# Patient Record
Sex: Female | Born: 1955 | Race: White | Hispanic: No | Smoking: Former smoker
Health system: Southern US, Community
[De-identification: ages and names within clinical notes are randomized; demographics above are authoritative.]

## PROBLEM LIST (undated history)

## (undated) DIAGNOSIS — E079 Disorder of thyroid, unspecified: Secondary | ICD-10-CM

## (undated) DIAGNOSIS — A159 Respiratory tuberculosis unspecified: Secondary | ICD-10-CM

## (undated) DIAGNOSIS — M1990 Unspecified osteoarthritis, unspecified site: Secondary | ICD-10-CM

## (undated) DIAGNOSIS — F329 Major depressive disorder, single episode, unspecified: Secondary | ICD-10-CM

## (undated) DIAGNOSIS — F419 Anxiety disorder, unspecified: Secondary | ICD-10-CM

## (undated) DIAGNOSIS — R531 Weakness: Secondary | ICD-10-CM

## (undated) DIAGNOSIS — T8859XA Other complications of anesthesia, initial encounter: Secondary | ICD-10-CM

## (undated) DIAGNOSIS — R51 Headache: Secondary | ICD-10-CM

## (undated) DIAGNOSIS — T4145XA Adverse effect of unspecified anesthetic, initial encounter: Secondary | ICD-10-CM

## (undated) DIAGNOSIS — F32A Depression, unspecified: Secondary | ICD-10-CM

## (undated) DIAGNOSIS — G971 Other reaction to spinal and lumbar puncture: Secondary | ICD-10-CM

## (undated) DIAGNOSIS — H9190 Unspecified hearing loss, unspecified ear: Secondary | ICD-10-CM

## (undated) DIAGNOSIS — E039 Hypothyroidism, unspecified: Secondary | ICD-10-CM

## (undated) DIAGNOSIS — Z860100 Personal history of colon polyps, unspecified: Secondary | ICD-10-CM

## (undated) DIAGNOSIS — R609 Edema, unspecified: Secondary | ICD-10-CM

## (undated) DIAGNOSIS — K219 Gastro-esophageal reflux disease without esophagitis: Secondary | ICD-10-CM

## (undated) DIAGNOSIS — R600 Localized edema: Secondary | ICD-10-CM

## (undated) DIAGNOSIS — IMO0001 Reserved for inherently not codable concepts without codable children: Secondary | ICD-10-CM

## (undated) DIAGNOSIS — H8109 Meniere's disease, unspecified ear: Secondary | ICD-10-CM

## (undated) DIAGNOSIS — G4730 Sleep apnea, unspecified: Secondary | ICD-10-CM

## (undated) DIAGNOSIS — K5900 Constipation, unspecified: Secondary | ICD-10-CM

## (undated) HISTORY — DX: Constipation, unspecified: K59.00

## (undated) HISTORY — PX: TONSILLECTOMY: SUR1361

## (undated) HISTORY — DX: Disorder of thyroid, unspecified: E07.9

## (undated) HISTORY — PX: POLYPECTOMY: SHX149

## (undated) HISTORY — DX: Depression, unspecified: F32.A

## (undated) HISTORY — PX: DILATION AND CURETTAGE OF UTERUS: SHX78

## (undated) HISTORY — PX: TUBAL LIGATION: SHX77

## (undated) HISTORY — DX: Unspecified osteoarthritis, unspecified site: M19.90

## (undated) HISTORY — PX: COLONOSCOPY W/ BIOPSIES AND POLYPECTOMY: SHX1376

## (undated) HISTORY — PX: COLONOSCOPY: SHX174

## (undated) HISTORY — DX: Respiratory tuberculosis unspecified: A15.9

## (undated) HISTORY — PX: BUNIONECTOMY: SHX129

## (undated) HISTORY — DX: Meniere's disease, unspecified ear: H81.09

## (undated) HISTORY — DX: Major depressive disorder, single episode, unspecified: F32.9

---

## 1998-10-26 ENCOUNTER — Other Ambulatory Visit: Admission: RE | Admit: 1998-10-26 | Discharge: 1998-10-26 | Payer: Self-pay | Admitting: Obstetrics & Gynecology

## 1999-02-15 ENCOUNTER — Ambulatory Visit (HOSPITAL_COMMUNITY): Admission: RE | Admit: 1999-02-15 | Discharge: 1999-02-15 | Payer: Self-pay | Admitting: Neurosurgery

## 1999-02-15 ENCOUNTER — Encounter: Payer: Self-pay | Admitting: Neurosurgery

## 1999-03-12 ENCOUNTER — Encounter: Payer: Self-pay | Admitting: Neurosurgery

## 1999-03-12 ENCOUNTER — Ambulatory Visit (HOSPITAL_COMMUNITY): Admission: RE | Admit: 1999-03-12 | Discharge: 1999-03-12 | Payer: Self-pay | Admitting: Neurosurgery

## 1999-12-01 ENCOUNTER — Other Ambulatory Visit: Admission: RE | Admit: 1999-12-01 | Discharge: 1999-12-01 | Payer: Self-pay | Admitting: Obstetrics & Gynecology

## 1999-12-07 ENCOUNTER — Ambulatory Visit (HOSPITAL_COMMUNITY): Admission: RE | Admit: 1999-12-07 | Discharge: 1999-12-07 | Payer: Self-pay | Admitting: Obstetrics & Gynecology

## 1999-12-07 ENCOUNTER — Encounter: Payer: Self-pay | Admitting: Obstetrics & Gynecology

## 2001-02-08 ENCOUNTER — Other Ambulatory Visit: Admission: RE | Admit: 2001-02-08 | Discharge: 2001-02-08 | Payer: Self-pay | Admitting: Obstetrics & Gynecology

## 2002-03-15 ENCOUNTER — Other Ambulatory Visit: Admission: RE | Admit: 2002-03-15 | Discharge: 2002-03-15 | Payer: Self-pay | Admitting: Obstetrics & Gynecology

## 2002-03-27 ENCOUNTER — Encounter: Payer: Self-pay | Admitting: Obstetrics & Gynecology

## 2002-03-27 ENCOUNTER — Ambulatory Visit (HOSPITAL_COMMUNITY): Admission: RE | Admit: 2002-03-27 | Discharge: 2002-03-27 | Payer: Self-pay | Admitting: Obstetrics & Gynecology

## 2003-04-01 ENCOUNTER — Other Ambulatory Visit: Admission: RE | Admit: 2003-04-01 | Discharge: 2003-04-01 | Payer: Self-pay | Admitting: Obstetrics & Gynecology

## 2003-04-02 ENCOUNTER — Ambulatory Visit (HOSPITAL_COMMUNITY): Admission: RE | Admit: 2003-04-02 | Discharge: 2003-04-02 | Payer: Self-pay | Admitting: Obstetrics & Gynecology

## 2003-04-02 ENCOUNTER — Encounter: Payer: Self-pay | Admitting: Obstetrics & Gynecology

## 2004-04-08 ENCOUNTER — Encounter: Admission: RE | Admit: 2004-04-08 | Discharge: 2004-04-08 | Payer: Self-pay | Admitting: Obstetrics & Gynecology

## 2004-05-26 ENCOUNTER — Other Ambulatory Visit: Admission: RE | Admit: 2004-05-26 | Discharge: 2004-05-26 | Payer: Self-pay | Admitting: Obstetrics & Gynecology

## 2004-08-05 ENCOUNTER — Encounter: Admission: RE | Admit: 2004-08-05 | Discharge: 2004-08-05 | Payer: Self-pay | Admitting: Diagnostic Radiology

## 2005-01-13 ENCOUNTER — Ambulatory Visit (HOSPITAL_BASED_OUTPATIENT_CLINIC_OR_DEPARTMENT_OTHER): Admission: RE | Admit: 2005-01-13 | Discharge: 2005-01-14 | Payer: Self-pay | Admitting: Orthopedic Surgery

## 2005-01-13 ENCOUNTER — Ambulatory Visit (HOSPITAL_COMMUNITY): Admission: RE | Admit: 2005-01-13 | Discharge: 2005-01-13 | Payer: Self-pay | Admitting: Orthopedic Surgery

## 2005-07-22 ENCOUNTER — Encounter: Admission: RE | Admit: 2005-07-22 | Discharge: 2005-07-22 | Payer: Self-pay | Admitting: Orthopedic Surgery

## 2005-08-02 ENCOUNTER — Other Ambulatory Visit: Admission: RE | Admit: 2005-08-02 | Discharge: 2005-08-02 | Payer: Self-pay | Admitting: Obstetrics & Gynecology

## 2005-08-03 ENCOUNTER — Encounter: Admission: RE | Admit: 2005-08-03 | Discharge: 2005-08-03 | Payer: Self-pay | Admitting: Obstetrics & Gynecology

## 2005-11-10 ENCOUNTER — Ambulatory Visit (HOSPITAL_BASED_OUTPATIENT_CLINIC_OR_DEPARTMENT_OTHER): Admission: RE | Admit: 2005-11-10 | Discharge: 2005-11-11 | Payer: Self-pay | Admitting: Orthopedic Surgery

## 2005-12-07 ENCOUNTER — Encounter: Admission: RE | Admit: 2005-12-07 | Discharge: 2005-12-07 | Payer: Self-pay | Admitting: Orthopedic Surgery

## 2006-04-27 ENCOUNTER — Encounter: Admission: RE | Admit: 2006-04-27 | Discharge: 2006-04-27 | Payer: Self-pay | Admitting: Orthopedic Surgery

## 2006-08-09 ENCOUNTER — Encounter: Admission: RE | Admit: 2006-08-09 | Discharge: 2006-08-09 | Payer: Self-pay | Admitting: Obstetrics & Gynecology

## 2008-05-26 ENCOUNTER — Encounter: Admission: RE | Admit: 2008-05-26 | Discharge: 2008-05-26 | Payer: Self-pay | Admitting: Obstetrics & Gynecology

## 2008-07-08 ENCOUNTER — Encounter: Admission: RE | Admit: 2008-07-08 | Discharge: 2008-07-08 | Payer: Self-pay | Admitting: Endocrinology

## 2008-12-24 ENCOUNTER — Encounter: Admission: RE | Admit: 2008-12-24 | Discharge: 2008-12-24 | Payer: Self-pay | Admitting: Endocrinology

## 2009-06-22 ENCOUNTER — Encounter: Admission: RE | Admit: 2009-06-22 | Discharge: 2009-06-22 | Payer: Self-pay | Admitting: General Surgery

## 2009-07-21 ENCOUNTER — Encounter: Admission: RE | Admit: 2009-07-21 | Discharge: 2009-07-21 | Payer: Self-pay | Admitting: Obstetrics & Gynecology

## 2010-08-15 ENCOUNTER — Encounter: Payer: Self-pay | Admitting: Obstetrics & Gynecology

## 2010-10-06 ENCOUNTER — Other Ambulatory Visit: Payer: Self-pay | Admitting: Endocrinology

## 2010-10-06 DIAGNOSIS — E049 Nontoxic goiter, unspecified: Secondary | ICD-10-CM

## 2010-10-22 ENCOUNTER — Ambulatory Visit (HOSPITAL_BASED_OUTPATIENT_CLINIC_OR_DEPARTMENT_OTHER)
Admission: RE | Admit: 2010-10-22 | Discharge: 2010-10-22 | Disposition: A | Discharge status: Home / Self Care | Payer: 59 | Source: Ambulatory Visit | Attending: Endocrinology | Admitting: Endocrinology

## 2010-10-22 DIAGNOSIS — E039 Hypothyroidism, unspecified: Secondary | ICD-10-CM | POA: Insufficient documentation

## 2010-10-22 DIAGNOSIS — E041 Nontoxic single thyroid nodule: Secondary | ICD-10-CM | POA: Insufficient documentation

## 2010-10-22 DIAGNOSIS — E049 Nontoxic goiter, unspecified: Secondary | ICD-10-CM

## 2010-11-08 ENCOUNTER — Other Ambulatory Visit: Payer: Self-pay

## 2010-11-26 ENCOUNTER — Encounter: Payer: Self-pay | Admitting: Internal Medicine

## 2010-11-26 ENCOUNTER — Ambulatory Visit (AMBULATORY_SURGERY_CENTER): Payer: 59 | Admitting: *Deleted

## 2010-11-26 VITALS — Ht 69.0 in | Wt 229.0 lb

## 2010-11-26 DIAGNOSIS — Z1211 Encounter for screening for malignant neoplasm of colon: Secondary | ICD-10-CM

## 2010-11-26 MED ORDER — PEG-KCL-NACL-NASULF-NA ASC-C 100 G PO SOLR: ORAL | Status: DC

## 2010-12-10 ENCOUNTER — Ambulatory Visit (AMBULATORY_SURGERY_CENTER): Payer: 59 | Admitting: Internal Medicine

## 2010-12-10 ENCOUNTER — Encounter: Payer: Self-pay | Admitting: Internal Medicine

## 2010-12-10 VITALS — BP 124/78 | HR 80 | Temp 97.3°F | Resp 18 | Ht 69.0 in | Wt 229.0 lb

## 2010-12-10 DIAGNOSIS — Z1211 Encounter for screening for malignant neoplasm of colon: Secondary | ICD-10-CM

## 2010-12-10 DIAGNOSIS — D126 Benign neoplasm of colon, unspecified: Secondary | ICD-10-CM

## 2010-12-10 DIAGNOSIS — K6389 Other specified diseases of intestine: Secondary | ICD-10-CM

## 2010-12-10 MED ORDER — SODIUM CHLORIDE 0.9 % IV SOLN: 500.0000 mL | INTRAVENOUS | Status: DC

## 2010-12-10 NOTE — Op Note (Signed)
Rose Boyer, Rose Boyer              ACCOUNT NO.:  [id number]   MEDICAL RECORD NO.:  [id number]          PATIENT TYPE:  AMB   LOCATION:  DSC                          FACILITY:  [facility]   PHYSICIAN:  Nadara Mustard, MD     DATE OF BIRTH:  [birth year]   DATE OF PROCEDURE:  01/13/2005  DATE OF DISCHARGE:                                 OPERATIVE REPORT   PREOPERATIVE DIAGNOSES:  1.  Hallux valgus deformity of right great toe.  2.  Clawing with metatarsalgia of the right second toe.   POSTOPERATIVE DIAGNOSES:  1.  Hallux valgus deformity of right great toe.  2.  Clawing with metatarsalgia of the right second toe.   PROCEDURE:  1.  Right first metatarsal Ludloff osteotomy.  2.  Right first proximal phalanx Aiken osteotomy.  3.  Second metatarsal Weil osteotomy.   SURGEON.:  Nadara Mustard, MD   ANESTHESIA:  Popliteal block plus LMA.   ESTIMATED BLOOD LOSS:  Minimal.   ANTIBIOTICS:  None.   DRAINS:  None.   COMPLICATIONS:  None.   DISPOSITION:  To PACU in stable condition.   INDICATIONS FOR PROCEDURE:  The patient is a 55 year old woman with a severe  hallux valgus deformity of the right great toe and clawing of the second toe  with prominent metatarsal head of the second toe. Patient has failed  conservative care and presents at this time for surgical intervention. The  risks and benefits were discussed including infection, neurovascular injury,  persistent pain, fracture of the bone, need for additional surgery. The  patient states she understands and wished proceed at this time.   DESCRIPTION OF PROCEDURE:  The patient was brought to OR room 6 underwent  after undergoing a popliteal block. The patient underwent LMA anesthetic.  After adequate level of anesthesia obtained, the patient's right lower  extremity was prepped using DuraPrep and draped into a sterile field. The  leg was elevated and Esmarch was wrapped around the ankle for tourniquet  control. An incision was made  longitudinally on the medial border of the  first metatarsal. This was carried down to bone. Subperiosteal dissection  was used to expose the medial aspect of the first metatarsal. A Ludloff  osteotomy was performed. This was secured proximally with a lag screw  technique 18 mm cortical screw. The osteotomy was then rotated, secured with  a distal cortical screw using lag screw technique.  An ostectomy of the  medial prominence of the first metatarsal as well as from the osteotomy site  was performed. The patient still had valgus deformity of the great toe and  an Aiken osteotomy was performed and the toe was stabilized using a 0.65 K-  wire. The wound was irrigated with normal saline. The retinaculum was closed  using a running 2-0 Vicryl. The skin was closed using 3-0 nylon with far-  near-near-far suture. Attention was then focused on the second toe. Incision  was carried down over the second MTP joint. Visualization showed a large  amount of synovitis and bone spurs over the second toe. A Weil osteotomy was  performed.  Bony spurs were removed. The synovitis was removed. This was  then stabilized with a 2.0 cortical mini fragment screw. The patient had  correction of the prominence of the second metatarsal. The wounds were  irrigated with normal saline. Skin was closed using 3-0 nylon using a far-  near-near-far suture. Wound was covered Adaptic orthopedic sponges, sterile  Webril and a Coban dressing. The patient was then taken to PACU in stable  condition. The tourniquet was deflated after the surgical procedure. The  patient was taken to PACU in stable condition. Plan for 23 hour observation,  discharge in the morning by nursing.  She is be touchdown weightbearing on  the right. She has a prescription for Vicodin and Tylox.  Follow-up in the  office on Monday for dressing change.       MVD/MEDQ  D:  01/13/2005  T:  01/13/2005  Job:  [id number]

## 2010-12-10 NOTE — Op Note (Signed)
Rose Boyer, Rose Boyer              ACCOUNT NO.:  [id number]   MEDICAL RECORD NO.:  [id number]          PATIENT TYPE:  AMB   LOCATION:  DSC                          FACILITY:  [facility]   PHYSICIAN:  Nadara Mustard, MD     DATE OF BIRTH:  January 11, 1956   DATE OF PROCEDURE:  11/10/2005  DATE OF DISCHARGE:                                 OPERATIVE REPORT   PREOP DIAGNOSIS:  1.  Severe hallux valgus deformity left foot.  2.  Painful retained hardware right foot.   PROCEDURE:  1.  Removal of deep buried hardware right foot screws x2.  2.  Ludloff osteotomy left great toe first metatarsal.  3  Aiken osteotomy left great toe proximal phalanx.   SURGEON:  Nadara Mustard, MD   ANESTHESIA:  General.   ESTIMATED BLOOD LOSS:  Minimal.   ANTIBIOTICS:  1 gram of Kefzol.   DRAINS:  None.   COMPLICATIONS:  None   TOURNIQUET TIME:  Esmarch at the ankle for approximately 27 minutes on the  left ankle.   DISPOSITION:  To PACU in stable condition.   INDICATIONS FOR PROCEDURE:  The patient is a 55 year old woman who was  status post bunion surgery on the right. Patient is pleased with her  progress with the right foot; and has progressive pain with the left foot;  also has pain with retained hardware in the right foot. She had hallux  valgus angle on the left of 60 degrees, and intermetatarsal angle of 20  degrees and presents, at this time, for surgical intervention for bunion  surgery on the left including a Ludloff and Aiken osteotomy, and the removal  of deep hardware on the right. Risks and benefits were discussed including  infection, neurovascular injury, persistent pain, recurrence of the  deformity, need for additional surgery. The patient states that he  understands and wishes to proceed at this time.   DESCRIPTION OF PROCEDURE:  The patient was brought to OR room #5 and  underwent a general anesthetic. After an adequate level of anesthesia was  obtained the patient's bilateral lower  extremities were prepped using  DuraPrep and draped in a sterile field. Two stab incisions were made over  the screws on the right foot. Using C-arm fluoroscopy for guidance the 2  screws were removed without complications. The wounds were irrigated; and  the incisions were closed using a closed using 2-0 nylon with a modified  vertical mattress suture. The foot was covered; and attention was then  focused on the left foot. An esmarch was wrapped around the ankle for  tourniquet control. A medial longitudinal incision was made over the first  column. This was carried down to the bone. Subperiosteal dissection was used  to incise the capsule. An ostectomy was performed off the medial eminence of  the great toe first metatarsal. A separate incision was then made in the  first web space and the capsule was released, releasing the abductor  muscles.   Attention was then focused on the first metatarsal. A Ludloff osteotomy was  performed. This was secured proximally with a  2.7 screw and distally, also,  with a 2.7 screw.  A second ostectomy was performed from the bone of the  Ludloff osteotomy.   Attention was then focused on the proximal phalanx. A wedge ostectomy was  performed with a lateral wedge resected from the first metatarsal. The toe  was aligned with the first column and this was stabilized with a 1.6 mm K-  wire. The wounds were irrigated with normal saline. The Esmarch was released  after 27 minutes. Hemostasis was obtained. The retinaculum was closed using  a 2-0 Vicryl. The skin was closed using a modified vertical mattress with 2-  0 nylon. The wounds were covered with Adaptic orthopedic sponges, Webril,  and a Coban dressing for both feet. The patient was extubated; taken to PACU  in stable condition; follow up in office in 2 weeks.      Nadara Mustard, MD  Electronically Signed     MVD/MEDQ  D:  11/10/2005  T:  11/11/2005  Job:  [id number]

## 2010-12-10 NOTE — Patient Instructions (Signed)
Refer to blue and green discharge instruction sheets. 

## 2010-12-13 ENCOUNTER — Telehealth: Payer: Self-pay

## 2010-12-13 NOTE — Telephone Encounter (Signed)

## 2010-12-16 ENCOUNTER — Encounter: Payer: Self-pay | Admitting: Internal Medicine

## 2011-06-15 ENCOUNTER — Encounter (HOSPITAL_BASED_OUTPATIENT_CLINIC_OR_DEPARTMENT_OTHER): Payer: Self-pay | Admitting: Radiology

## 2011-06-15 ENCOUNTER — Other Ambulatory Visit (HOSPITAL_BASED_OUTPATIENT_CLINIC_OR_DEPARTMENT_OTHER): Payer: Self-pay | Admitting: Radiology

## 2011-11-23 ENCOUNTER — Ambulatory Visit (HOSPITAL_BASED_OUTPATIENT_CLINIC_OR_DEPARTMENT_OTHER)
Admission: RE | Admit: 2011-11-23 | Discharge: 2011-11-23 | Disposition: A | Discharge status: Home / Self Care | Payer: 59 | Source: Ambulatory Visit | Attending: Obstetrics & Gynecology | Admitting: Obstetrics & Gynecology

## 2011-11-23 ENCOUNTER — Other Ambulatory Visit (HOSPITAL_BASED_OUTPATIENT_CLINIC_OR_DEPARTMENT_OTHER): Payer: Self-pay | Admitting: Obstetrics & Gynecology

## 2011-11-23 DIAGNOSIS — Z1231 Encounter for screening mammogram for malignant neoplasm of breast: Secondary | ICD-10-CM

## 2012-03-01 ENCOUNTER — Ambulatory Visit (INDEPENDENT_AMBULATORY_CARE_PROVIDER_SITE_OTHER): Payer: 59 | Admitting: Internal Medicine

## 2012-03-01 ENCOUNTER — Encounter: Payer: Self-pay | Admitting: Internal Medicine

## 2012-03-01 VITALS — BP 110/70 | HR 88 | Temp 97.2°F | Resp 16 | Ht 69.0 in | Wt 233.0 lb

## 2012-03-01 DIAGNOSIS — Z803 Family history of malignant neoplasm of breast: Secondary | ICD-10-CM

## 2012-03-01 DIAGNOSIS — F3289 Other specified depressive episodes: Secondary | ICD-10-CM

## 2012-03-01 DIAGNOSIS — H8109 Meniere's disease, unspecified ear: Secondary | ICD-10-CM | POA: Insufficient documentation

## 2012-03-01 DIAGNOSIS — E042 Nontoxic multinodular goiter: Secondary | ICD-10-CM

## 2012-03-01 DIAGNOSIS — R0609 Other forms of dyspnea: Secondary | ICD-10-CM

## 2012-03-01 DIAGNOSIS — F329 Major depressive disorder, single episode, unspecified: Secondary | ICD-10-CM

## 2012-03-01 DIAGNOSIS — R0600 Dyspnea, unspecified: Secondary | ICD-10-CM

## 2012-03-01 DIAGNOSIS — E669 Obesity, unspecified: Secondary | ICD-10-CM

## 2012-03-01 DIAGNOSIS — R0989 Other specified symptoms and signs involving the circulatory and respiratory systems: Secondary | ICD-10-CM

## 2012-03-01 DIAGNOSIS — Z780 Asymptomatic menopausal state: Secondary | ICD-10-CM

## 2012-03-01 DIAGNOSIS — F32A Depression, unspecified: Secondary | ICD-10-CM | POA: Insufficient documentation

## 2012-03-01 NOTE — Progress Notes (Signed)
  Subjective:    Patient ID: Rose Boyer, female    DOB: 05-05-1956, 56 y.o.   MRN: [id number]  HPI  Rose Boyer is a new pt here to establish primary care.  Former care from her OB/GYN.  PMH of MNG cared for by Dr. Talmage Nap, Depression, meniere's disease and menopause.  FH positive for breast cancer in mother  Rose Boyer is concerned over multiple issues that include her increased weight, dyspnea on exertion and states  "I just don't feel good and I am not sure why".  She denies chest pain or palpitations  She states she spoke with Dr. Jennette Kettle and asked him to place her back on HT .  She has been on Vivelle dot and Provera q 3months for about 4 months now.  She denies severe hot flushes or night sweats.  She also reports that she was on both Wellbutrin and Lexapro in the past but she stopped the Lexapro in the last 18 months.  She has never seen a psychiatrist for a diagnosis and is not seeing a therapist  She is aware her job is stressful but she states she has a good life.  Father had depression   Review of Systems See HPI    Objective:   Physical ExamPhysical Exam  Nursing note and vitals reviewed.  Constitutional: She is oriented to person, place, and time. She appears well-developed and well-nourished.  HENT:  Head: Normocephalic and atraumatic.  Cardiovascular: Normal rate and regular rhythm. Exam reveals no gallop and no friction rub.  No murmur heard.  Pulmonary/Chest: Breath sounds normal. She has no wheezes. She has no rales.  Neurological: She is alert and oriented to person, place, and time.  Skin: Skin is warm and dry.  Psychiatric: She has a normal mood and affect. Her behavior is normal.              Assessment & Plan:  Depression  I gave her the nunmber to two therapists Rose Boyer and Rose Boyer and advised her to call for talking therapy  Weight gain  Will check TSH today and refer to Dr. Kinnie Scales  Dyspnea  willl refer to Dr. Allyson Sabal for further diagnostic  assessment.  Check fasting lipids in am   MNG managed by dr. Talmage Nap  FH breast ca in mother  History of menieres disease Dr. Dorma Russell

## 2012-03-01 NOTE — Patient Instructions (Addendum)
Labs will be mailed to you  Will set up referrral to Gi and cardiology

## 2012-03-05 ENCOUNTER — Telehealth: Payer: Self-pay | Admitting: Internal Medicine

## 2012-03-05 NOTE — Telephone Encounter (Signed)
LM on work voice mail to call and we would do an EKG as we meant to get it last week when she was here.

## 2012-03-05 NOTE — Telephone Encounter (Signed)
Rose Boyer  Call pt and ask her to come in this week sometime for an EKG when I an here and I will interpret EKG for her

## 2012-03-06 ENCOUNTER — Ambulatory Visit (HOSPITAL_BASED_OUTPATIENT_CLINIC_OR_DEPARTMENT_OTHER)
Admission: RE | Admit: 2012-03-06 | Discharge: 2012-03-06 | Disposition: A | Discharge status: Home / Self Care | Payer: 59 | Source: Ambulatory Visit | Attending: Obstetrics & Gynecology | Admitting: Obstetrics & Gynecology

## 2012-03-06 ENCOUNTER — Other Ambulatory Visit (HOSPITAL_BASED_OUTPATIENT_CLINIC_OR_DEPARTMENT_OTHER): Payer: Self-pay | Admitting: Obstetrics & Gynecology

## 2012-03-06 DIAGNOSIS — Z139 Encounter for screening, unspecified: Secondary | ICD-10-CM

## 2012-03-08 LAB — CBC WITH DIFFERENTIAL/PLATELET
Basophils Absolute: 0 10*3/uL (ref 0.0–0.1)
Basophils Relative: 1 % (ref 0–1)
Eosinophils Absolute: 0 10*3/uL (ref 0.0–0.7)
Eosinophils Relative: 1 % (ref 0–5)
MCH: 30.4 pg (ref 26.0–34.0)
MCV: 85.9 fL (ref 78.0–100.0)
Neutrophils Relative %: 56 % (ref 43–77)
Platelets: 331 10*3/uL (ref 150–400)
RDW: 13.2 % (ref 11.5–15.5)
WBC: 4.8 10*3/uL (ref 4.0–10.5)

## 2012-03-08 LAB — COMPREHENSIVE METABOLIC PANEL
ALT: 38 U/L — ABNORMAL HIGH (ref 0–35)
AST: 25 U/L (ref 0–37)
Alkaline Phosphatase: 88 U/L (ref 39–117)
Creat: 1.06 mg/dL (ref 0.50–1.10)
Sodium: 140 mEq/L (ref 135–145)
Total Bilirubin: 0.6 mg/dL (ref 0.3–1.2)

## 2012-03-08 LAB — LIPID PANEL
Cholesterol: 229 mg/dL — ABNORMAL HIGH (ref 0–200)
HDL: 59 mg/dL (ref >39)
Triglycerides: 125 mg/dL (ref <150)

## 2012-03-09 LAB — VITAMIN D 25 HYDROXY (VIT D DEFICIENCY, FRACTURES): Vit D, 25-Hydroxy: 31 ng/mL (ref 30–89)

## 2012-03-13 ENCOUNTER — Telehealth: Payer: Self-pay | Admitting: *Deleted

## 2012-03-13 NOTE — Telephone Encounter (Signed)
Copy of labs mailed to pt's home address. 

## 2012-03-30 ENCOUNTER — Other Ambulatory Visit: Payer: Self-pay | Admitting: Occupational Medicine

## 2012-03-30 ENCOUNTER — Ambulatory Visit (HOSPITAL_BASED_OUTPATIENT_CLINIC_OR_DEPARTMENT_OTHER): Payer: Self-pay

## 2012-03-30 DIAGNOSIS — Z0000 Encounter for general adult medical examination without abnormal findings: Secondary | ICD-10-CM

## 2012-08-24 ENCOUNTER — Ambulatory Visit (HOSPITAL_BASED_OUTPATIENT_CLINIC_OR_DEPARTMENT_OTHER)
Admission: RE | Admit: 2012-08-24 | Discharge: 2012-08-24 | Disposition: A | Discharge status: Home / Self Care | Payer: 59 | Source: Ambulatory Visit | Attending: Endocrinology | Admitting: Endocrinology

## 2012-08-24 ENCOUNTER — Other Ambulatory Visit (HOSPITAL_BASED_OUTPATIENT_CLINIC_OR_DEPARTMENT_OTHER): Payer: Self-pay | Admitting: Endocrinology

## 2012-08-24 DIAGNOSIS — E049 Nontoxic goiter, unspecified: Secondary | ICD-10-CM | POA: Insufficient documentation

## 2012-08-24 DIAGNOSIS — Z09 Encounter for follow-up examination after completed treatment for conditions other than malignant neoplasm: Secondary | ICD-10-CM | POA: Insufficient documentation

## 2012-11-27 ENCOUNTER — Emergency Department (HOSPITAL_BASED_OUTPATIENT_CLINIC_OR_DEPARTMENT_OTHER)
Admission: EM | Admit: 2012-11-27 | Discharge: 2012-11-27 | Disposition: A | Discharge status: Home / Self Care | Payer: 59 | Attending: Emergency Medicine | Admitting: Emergency Medicine

## 2012-11-27 ENCOUNTER — Emergency Department (HOSPITAL_BASED_OUTPATIENT_CLINIC_OR_DEPARTMENT_OTHER): Payer: 59

## 2012-11-27 ENCOUNTER — Encounter (HOSPITAL_BASED_OUTPATIENT_CLINIC_OR_DEPARTMENT_OTHER): Payer: Self-pay | Admitting: *Deleted

## 2012-11-27 DIAGNOSIS — Z87891 Personal history of nicotine dependence: Secondary | ICD-10-CM | POA: Diagnosis not present

## 2012-11-27 DIAGNOSIS — E079 Disorder of thyroid, unspecified: Secondary | ICD-10-CM | POA: Diagnosis not present

## 2012-11-27 DIAGNOSIS — Y9389 Activity, other specified: Secondary | ICD-10-CM | POA: Insufficient documentation

## 2012-11-27 DIAGNOSIS — S199XXA Unspecified injury of neck, initial encounter: Secondary | ICD-10-CM | POA: Diagnosis present

## 2012-11-27 DIAGNOSIS — F3289 Other specified depressive episodes: Secondary | ICD-10-CM | POA: Diagnosis not present

## 2012-11-27 DIAGNOSIS — Z8669 Personal history of other diseases of the nervous system and sense organs: Secondary | ICD-10-CM | POA: Diagnosis not present

## 2012-11-27 DIAGNOSIS — Z79899 Other long term (current) drug therapy: Secondary | ICD-10-CM | POA: Diagnosis not present

## 2012-11-27 DIAGNOSIS — Z8611 Personal history of tuberculosis: Secondary | ICD-10-CM | POA: Insufficient documentation

## 2012-11-27 DIAGNOSIS — Y92410 Unspecified street and highway as the place of occurrence of the external cause: Secondary | ICD-10-CM | POA: Diagnosis not present

## 2012-11-27 DIAGNOSIS — Z8619 Personal history of other infectious and parasitic diseases: Secondary | ICD-10-CM | POA: Insufficient documentation

## 2012-11-27 DIAGNOSIS — Z8739 Personal history of other diseases of the musculoskeletal system and connective tissue: Secondary | ICD-10-CM | POA: Diagnosis not present

## 2012-11-27 DIAGNOSIS — F329 Major depressive disorder, single episode, unspecified: Secondary | ICD-10-CM | POA: Diagnosis not present

## 2012-11-27 DIAGNOSIS — R209 Unspecified disturbances of skin sensation: Secondary | ICD-10-CM | POA: Insufficient documentation

## 2012-11-27 DIAGNOSIS — S0993XA Unspecified injury of face, initial encounter: Secondary | ICD-10-CM | POA: Diagnosis present

## 2012-11-27 DIAGNOSIS — S161XXA Strain of muscle, fascia and tendon at neck level, initial encounter: Secondary | ICD-10-CM

## 2012-11-27 DIAGNOSIS — S139XXA Sprain of joints and ligaments of unspecified parts of neck, initial encounter: Secondary | ICD-10-CM | POA: Diagnosis not present

## 2012-11-27 MED ORDER — HYDROCODONE-ACETAMINOPHEN 5-325 MG PO TABS: 2.0000 | ORAL_TABLET | ORAL | Status: DC | PRN

## 2012-11-27 MED ORDER — IBUPROFEN 800 MG PO TABS: 800.0000 mg | ORAL_TABLET | Freq: Three times a day (TID) | ORAL | Status: DC
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
## 2012-11-27 NOTE — ED Notes (Signed)
Patient transported to & from X-ray. 

## 2012-11-27 NOTE — ED Notes (Signed)
PA at bedside giving detailed test results and DC instructions.

## 2012-11-28 NOTE — ED Provider Notes (Signed)
Medical screening examination/treatment/procedure(s) were performed by non-physician practitioner and as supervising physician I was immediately available for consultation/collaboration.  Hurman Horn, MD 11/28/12 2117

## 2012-12-04 ENCOUNTER — Encounter: Payer: Self-pay | Admitting: Family Medicine

## 2012-12-04 ENCOUNTER — Ambulatory Visit (INDEPENDENT_AMBULATORY_CARE_PROVIDER_SITE_OTHER): Payer: Self-pay | Admitting: Family Medicine

## 2012-12-04 VITALS — BP 121/84 | HR 83 | Ht 69.0 in

## 2012-12-04 DIAGNOSIS — S0993XA Unspecified injury of face, initial encounter: Secondary | ICD-10-CM

## 2012-12-04 DIAGNOSIS — S199XXA Unspecified injury of neck, initial encounter: Secondary | ICD-10-CM | POA: Insufficient documentation

## 2012-12-04 MED ORDER — HYDROCODONE-ACETAMINOPHEN 5-325 MG PO TABS: 1.0000 | ORAL_TABLET | Freq: Four times a day (QID) | ORAL | Status: DC | PRN

## 2012-12-04 MED ORDER — CYCLOBENZAPRINE HCL 10 MG PO TABS: 10.0000 mg | ORAL_TABLET | Freq: Three times a day (TID) | ORAL | Status: DC | PRN
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
## 2012-12-04 NOTE — Patient Instructions (Addendum)
Your symptoms are most consistent with a small disc herniation though cervical muscle strain with peripheral nerve irritation is also possible. Both are treated similarly to begin with. Ibuprofen 800mg  three times a day with food for pain and inflammation. Flexeril three times a day as needed for muscle spasms (can make you sleepy - if so do not drive while taking this). Norco as needed for severe pain (no driving on this medicine). Consider cervical collar if severely painful. Simple range of motion exercises within limits of pain to prevent further stiffness. Start physical therapy for stretching, exercises, traction, and modalities. Do home exercises on days you don't do therapy. Heat 15 minutes at a time 3-4 times a day to help with spasms. Watch head position when on computers, texting, when sleeping in bed - should in line with back to prevent further nerve traction and irritation. If not improving we will consider an MRI. Follow up with me in 1 month for reevaluation.

## 2012-12-05 ENCOUNTER — Ambulatory Visit: Payer: 59 | Attending: Family Medicine | Admitting: Physical Therapy

## 2012-12-05 DIAGNOSIS — M6281 Muscle weakness (generalized): Secondary | ICD-10-CM | POA: Insufficient documentation

## 2012-12-05 DIAGNOSIS — M542 Cervicalgia: Secondary | ICD-10-CM | POA: Diagnosis not present

## 2012-12-05 DIAGNOSIS — IMO0001 Reserved for inherently not codable concepts without codable children: Secondary | ICD-10-CM | POA: Insufficient documentation

## 2012-12-05 DIAGNOSIS — M25519 Pain in unspecified shoulder: Secondary | ICD-10-CM | POA: Insufficient documentation

## 2012-12-12 ENCOUNTER — Ambulatory Visit: Payer: 59 | Admitting: Physical Therapy

## 2012-12-12 DIAGNOSIS — IMO0001 Reserved for inherently not codable concepts without codable children: Secondary | ICD-10-CM | POA: Diagnosis not present

## 2012-12-26 ENCOUNTER — Ambulatory Visit: Payer: 59 | Attending: Family Medicine | Admitting: Physical Therapy

## 2012-12-26 ENCOUNTER — Ambulatory Visit: Payer: 59 | Admitting: Physical Therapy

## 2012-12-26 DIAGNOSIS — IMO0001 Reserved for inherently not codable concepts without codable children: Secondary | ICD-10-CM | POA: Insufficient documentation

## 2012-12-26 DIAGNOSIS — M542 Cervicalgia: Secondary | ICD-10-CM | POA: Diagnosis not present

## 2012-12-26 DIAGNOSIS — M6281 Muscle weakness (generalized): Secondary | ICD-10-CM | POA: Diagnosis not present

## 2012-12-26 DIAGNOSIS — M25519 Pain in unspecified shoulder: Secondary | ICD-10-CM | POA: Insufficient documentation

## 2012-12-27 ENCOUNTER — Other Ambulatory Visit: Payer: Self-pay | Admitting: *Deleted

## 2012-12-27 MED ORDER — TRAMADOL HCL 50 MG PO TABS: 50.0000 mg | ORAL_TABLET | Freq: Three times a day (TID) | ORAL | Status: DC | PRN

## 2013-01-02 ENCOUNTER — Ambulatory Visit: Payer: 59 | Admitting: Physical Therapy

## 2013-01-02 DIAGNOSIS — IMO0001 Reserved for inherently not codable concepts without codable children: Secondary | ICD-10-CM | POA: Diagnosis not present

## 2013-01-09 ENCOUNTER — Ambulatory Visit: Payer: 59 | Admitting: Physical Therapy

## 2013-01-23 ENCOUNTER — Ambulatory Visit (INDEPENDENT_AMBULATORY_CARE_PROVIDER_SITE_OTHER): Payer: 59 | Admitting: Family Medicine

## 2013-01-23 ENCOUNTER — Encounter: Payer: Self-pay | Admitting: Family Medicine

## 2013-01-23 VITALS — BP 128/84 | HR 89 | Ht 69.0 in

## 2013-01-23 DIAGNOSIS — S199XXA Unspecified injury of neck, initial encounter: Secondary | ICD-10-CM

## 2013-01-23 DIAGNOSIS — S0993XA Unspecified injury of face, initial encounter: Secondary | ICD-10-CM

## 2013-01-23 MED ORDER — HYDROCODONE-ACETAMINOPHEN 5-325 MG PO TABS: 1.0000 | ORAL_TABLET | Freq: Four times a day (QID) | ORAL | Status: DC | PRN

## 2013-01-23 NOTE — Patient Instructions (Addendum)
Take ibuprofen as you have been three times a day as needed for pain. Norco as needed for severe pain (no driving on this). Continue home exercises for another 6 weeks or until you're completely improved (whichever is longer). Follow up with me as needed.

## 2013-01-24 ENCOUNTER — Encounter: Payer: Self-pay | Admitting: Family Medicine
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
## 2013-01-31 ENCOUNTER — Other Ambulatory Visit (HOSPITAL_BASED_OUTPATIENT_CLINIC_OR_DEPARTMENT_OTHER): Payer: Self-pay | Admitting: Obstetrics & Gynecology

## 2013-01-31 DIAGNOSIS — Z1231 Encounter for screening mammogram for malignant neoplasm of breast: Secondary | ICD-10-CM

## 2013-02-01 ENCOUNTER — Ambulatory Visit (HOSPITAL_BASED_OUTPATIENT_CLINIC_OR_DEPARTMENT_OTHER): Payer: Self-pay

## 2013-02-05 ENCOUNTER — Other Ambulatory Visit: Payer: Self-pay

## 2013-02-05 DIAGNOSIS — Z1231 Encounter for screening mammogram for malignant neoplasm of breast: Secondary | ICD-10-CM

## 2013-02-27 ENCOUNTER — Ambulatory Visit
Admission: RE | Admit: 2013-02-27 | Discharge: 2013-02-27 | Disposition: A | Discharge status: Home / Self Care | Payer: 59 | Source: Ambulatory Visit

## 2013-02-27 DIAGNOSIS — Z1231 Encounter for screening mammogram for malignant neoplasm of breast: Secondary | ICD-10-CM

## 2013-02-28 ENCOUNTER — Other Ambulatory Visit: Payer: Self-pay | Admitting: Obstetrics & Gynecology

## 2013-02-28 DIAGNOSIS — R928 Other abnormal and inconclusive findings on diagnostic imaging of breast: Secondary | ICD-10-CM

## 2013-03-14 ENCOUNTER — Ambulatory Visit
Admission: RE | Admit: 2013-03-14 | Discharge: 2013-03-14 | Disposition: A | Discharge status: Home / Self Care | Payer: 59 | Source: Ambulatory Visit | Attending: Obstetrics & Gynecology | Admitting: Obstetrics & Gynecology

## 2013-03-14 DIAGNOSIS — R928 Other abnormal and inconclusive findings on diagnostic imaging of breast: Secondary | ICD-10-CM

## 2013-03-15 ENCOUNTER — Other Ambulatory Visit: Payer: Self-pay

## 2013-03-28 ENCOUNTER — Ambulatory Visit: Payer: Self-pay | Admitting: Family Medicine

## 2013-04-02 ENCOUNTER — Ambulatory Visit (INDEPENDENT_AMBULATORY_CARE_PROVIDER_SITE_OTHER): Payer: 59 | Admitting: Family Medicine

## 2013-04-02 ENCOUNTER — Ambulatory Visit (INDEPENDENT_AMBULATORY_CARE_PROVIDER_SITE_OTHER): Payer: 59

## 2013-04-02 ENCOUNTER — Encounter: Payer: Self-pay | Admitting: Family Medicine

## 2013-04-02 VITALS — BP 115/72 | HR 87 | Ht 69.0 in

## 2013-04-02 DIAGNOSIS — M542 Cervicalgia: Secondary | ICD-10-CM

## 2013-04-02 DIAGNOSIS — Z5189 Encounter for other specified aftercare: Secondary | ICD-10-CM

## 2013-04-02 DIAGNOSIS — M5030 Other cervical disc degeneration, unspecified cervical region: Secondary | ICD-10-CM

## 2013-04-02 DIAGNOSIS — M47812 Spondylosis without myelopathy or radiculopathy, cervical region: Secondary | ICD-10-CM

## 2013-04-02 DIAGNOSIS — S199XXD Unspecified injury of neck, subsequent encounter: Secondary | ICD-10-CM

## 2013-04-02 DIAGNOSIS — IMO0002 Reserved for concepts with insufficient information to code with codable children: Secondary | ICD-10-CM

## 2013-04-03 ENCOUNTER — Encounter: Payer: Self-pay | Admitting: Family Medicine
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
## 2013-04-08 ENCOUNTER — Other Ambulatory Visit: Payer: Self-pay | Admitting: Family Medicine

## 2013-04-08 DIAGNOSIS — M5020 Other cervical disc displacement, unspecified cervical region: Secondary | ICD-10-CM

## 2013-04-08 DIAGNOSIS — M5410 Radiculopathy, site unspecified: Secondary | ICD-10-CM

## 2013-05-20 ENCOUNTER — Ambulatory Visit
Admission: RE | Admit: 2013-05-20 | Discharge: 2013-05-20 | Disposition: A | Discharge status: Home / Self Care | Payer: 59 | Source: Ambulatory Visit | Attending: Family Medicine | Admitting: Family Medicine

## 2013-05-20 DIAGNOSIS — M5410 Radiculopathy, site unspecified: Secondary | ICD-10-CM

## 2013-05-20 DIAGNOSIS — M5020 Other cervical disc displacement, unspecified cervical region: Secondary | ICD-10-CM

## 2013-05-20 MED ORDER — TRIAMCINOLONE ACETONIDE 40 MG/ML IJ SUSP (RADIOLOGY)
60.0000 mg | Freq: Once | INTRAMUSCULAR | Status: AC
  Administered 2013-05-20: 60 mg via EPIDURAL

## 2013-05-20 MED ORDER — IOHEXOL 300 MG/ML  SOLN
1.0000 mL | Freq: Once | INTRAMUSCULAR | Status: AC | PRN
  Administered 2013-05-20: 1 mL via EPIDURAL

## 2013-05-29 ENCOUNTER — Ambulatory Visit (INDEPENDENT_AMBULATORY_CARE_PROVIDER_SITE_OTHER): Payer: 59 | Admitting: Family Medicine

## 2013-05-29 ENCOUNTER — Encounter: Payer: Self-pay | Admitting: Family Medicine

## 2013-05-29 VITALS — BP 140/82 | HR 96 | Ht 69.0 in

## 2013-05-29 DIAGNOSIS — Z5189 Encounter for other specified aftercare: Secondary | ICD-10-CM

## 2013-05-29 DIAGNOSIS — S199XXD Unspecified injury of neck, subsequent encounter: Secondary | ICD-10-CM
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
## 2013-05-29 NOTE — Patient Instructions (Signed)
Referring to neurosurgery 
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
## 2013-08-13 ENCOUNTER — Other Ambulatory Visit: Payer: Self-pay | Admitting: Neurosurgery

## 2013-08-21 ENCOUNTER — Encounter (HOSPITAL_COMMUNITY): Payer: Self-pay | Admitting: Pharmacy Technician

## 2013-08-26 NOTE — Pre-Procedure Instructions (Signed)
Rose Boyer  08/26/2013   Your procedure is scheduled on:  Tues, Feb 10 @ 7:30 AM  Report to [facility]  at 5:30 AM.  Call this number if you have problems the morning of surgery: [phone]   Remember:   Do not eat food or drink liquids after midnight.   Take these medicines the morning of surgery with A SIP OF WATER: Wellbutrin(Bupropion) and Thyroid(Armour)               Stop taking your Ibuprofen. No Goody's,BC's,Aleve,Aspirin,Fish Oil,or any Herbal Medications   Do not wear jewelry, make-up or nail polish.  Do not wear lotions, powders, or perfumes. You may wear deodorant.  Do not shave 48 hours prior to surgery.   Do not bring valuables to the hospital.  [facility] is not responsible                  for any belongings or valuables.               Contacts, dentures or bridgework may not be worn into surgery.  Leave suitcase in the car. After surgery it may be brought to your room.  For patients admitted to the hospital, discharge time is determined by your                treatment team.               Patients discharged the day of surgery will not be allowed to drive  home.    Special Instructions: Shower using CHG 2 nights before surgery and the night before surgery.  If you shower the day of surgery use CHG.  Use special wash - you have one bottle of CHG for all showers.  You should use approximately 1/3 of the bottle for each shower.   Please read over the following fact sheets that you were given: Pain Booklet, Coughing and Deep Breathing, MRSA Information and Surgical Site Infection Prevention

## 2013-08-27 ENCOUNTER — Encounter (HOSPITAL_COMMUNITY): Payer: Self-pay

## 2013-08-27 ENCOUNTER — Encounter (HOSPITAL_COMMUNITY)
Admission: RE | Admit: 2013-08-27 | Discharge: 2013-08-27 | Disposition: A | Discharge status: Home / Self Care | Payer: 59 | Source: Ambulatory Visit | Attending: Neurosurgery | Admitting: Neurosurgery

## 2013-08-27 DIAGNOSIS — Z01812 Encounter for preprocedural laboratory examination: Secondary | ICD-10-CM | POA: Insufficient documentation

## 2013-08-27 HISTORY — DX: Adverse effect of unspecified anesthetic, initial encounter: T41.45XA

## 2013-08-27 HISTORY — DX: Unspecified hearing loss, unspecified ear: H91.90

## 2013-08-27 HISTORY — DX: Other complications of anesthesia, initial encounter: T88.59XA

## 2013-08-27 HISTORY — DX: Personal history of colon polyps, unspecified: Z86.0100

## 2013-08-27 HISTORY — DX: Reserved for inherently not codable concepts without codable children: IMO0001

## 2013-08-27 HISTORY — DX: Personal history of colonic polyps: Z86.010

## 2013-08-27 HISTORY — DX: Gastro-esophageal reflux disease without esophagitis: K21.9

## 2013-08-27 HISTORY — DX: Headache: R51

## 2013-08-27 HISTORY — DX: Localized edema: R60.0

## 2013-08-27 HISTORY — DX: Other reaction to spinal and lumbar puncture: G97.1

## 2013-08-27 HISTORY — DX: Weakness: R53.1

## 2013-08-27 HISTORY — DX: Edema, unspecified: R60.9

## 2013-08-27 LAB — CBC
HEMATOCRIT: 39.8 % (ref 36.0–46.0)
Hemoglobin: 14 g/dL (ref 12.0–15.0)
MCH: 31.2 pg (ref 26.0–34.0)
MCHC: 35.2 g/dL (ref 30.0–36.0)
MCV: 88.6 fL (ref 78.0–100.0)
Platelets: 291 10*3/uL (ref 150–400)
RBC: 4.49 MIL/uL (ref 3.87–5.11)
RDW: 12.5 % (ref 11.5–15.5)
WBC: 6 10*3/uL (ref 4.0–10.5)

## 2013-08-27 LAB — BASIC METABOLIC PANEL
BUN: 13 mg/dL (ref 6–23)
CHLORIDE: 97 meq/L (ref 96–112)
CO2: 29 mEq/L (ref 19–32)
CREATININE: 1.01 mg/dL (ref 0.50–1.10)
Calcium: 9.5 mg/dL (ref 8.4–10.5)
GFR calc Af Amer: 69 mL/min — ABNORMAL LOW (ref >90)
GFR calc non Af Amer: 60 mL/min — ABNORMAL LOW (ref >90)
GLUCOSE: 104 mg/dL — AB (ref 70–99)
POTASSIUM: 3.8 meq/L (ref 3.7–5.3)
Sodium: 139 mEq/L (ref 137–147)

## 2013-08-27 LAB — SURGICAL PCR SCREEN
MRSA, PCR: NEGATIVE
Staphylococcus aureus: NEGATIVE

## 2013-08-27 NOTE — Progress Notes (Signed)
Pt doesn't have a cardiologist  Denies ever having an echo/stress test/heart cath  Denies EKG or CXR in past yr  Medical Md is GYN with [facility]

## 2013-09-02 MED ORDER — CEFAZOLIN SODIUM-DEXTROSE 2-3 GM-% IV SOLR
2.0000 g | INTRAVENOUS | Status: AC
  Administered 2013-09-03: 2 g via INTRAVENOUS
  Filled 2013-09-02: qty 50
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
## 2013-09-03 ENCOUNTER — Encounter (HOSPITAL_COMMUNITY)
Admission: RE | Disposition: A | Discharge status: Home / Self Care | Payer: Self-pay | Source: Ambulatory Visit | Attending: Neurosurgery

## 2013-09-03 ENCOUNTER — Ambulatory Visit (HOSPITAL_COMMUNITY)
Admission: RE | Admit: 2013-09-03 | Discharge: 2013-09-04 | Disposition: A | Discharge status: Home / Self Care | Payer: 59 | Source: Ambulatory Visit | Attending: Neurosurgery | Admitting: Neurosurgery

## 2013-09-03 ENCOUNTER — Ambulatory Visit (HOSPITAL_COMMUNITY): Payer: 59 | Admitting: Anesthesiology

## 2013-09-03 ENCOUNTER — Encounter (HOSPITAL_COMMUNITY): Payer: 59 | Admitting: Anesthesiology

## 2013-09-03 ENCOUNTER — Ambulatory Visit (HOSPITAL_COMMUNITY): Payer: 59

## 2013-09-03 ENCOUNTER — Encounter (HOSPITAL_COMMUNITY): Payer: Self-pay | Admitting: *Deleted

## 2013-09-03 DIAGNOSIS — K219 Gastro-esophageal reflux disease without esophagitis: Secondary | ICD-10-CM | POA: Insufficient documentation

## 2013-09-03 DIAGNOSIS — E039 Hypothyroidism, unspecified: Secondary | ICD-10-CM | POA: Insufficient documentation

## 2013-09-03 DIAGNOSIS — F329 Major depressive disorder, single episode, unspecified: Secondary | ICD-10-CM | POA: Insufficient documentation

## 2013-09-03 DIAGNOSIS — F3289 Other specified depressive episodes: Secondary | ICD-10-CM | POA: Insufficient documentation

## 2013-09-03 DIAGNOSIS — M4722 Other spondylosis with radiculopathy, cervical region: Secondary | ICD-10-CM | POA: Diagnosis present

## 2013-09-03 DIAGNOSIS — M4712 Other spondylosis with myelopathy, cervical region: Secondary | ICD-10-CM | POA: Insufficient documentation

## 2013-09-03 DIAGNOSIS — M5000 Cervical disc disorder with myelopathy, unspecified cervical region: Secondary | ICD-10-CM | POA: Insufficient documentation

## 2013-09-03 DIAGNOSIS — E669 Obesity, unspecified: Secondary | ICD-10-CM | POA: Insufficient documentation

## 2013-09-03 DIAGNOSIS — H9190 Unspecified hearing loss, unspecified ear: Secondary | ICD-10-CM | POA: Insufficient documentation

## 2013-09-03 HISTORY — PX: ANTERIOR CERVICAL DECOMP/DISCECTOMY FUSION: SHX1161

## 2013-09-03 SURGERY — ANTERIOR CERVICAL DECOMPRESSION/DISCECTOMY FUSION 2 LEVELS
Anesthesia: General | Site: Neck

## 2013-09-03 MED ORDER — FENTANYL CITRATE 0.05 MG/ML IJ SOLN
INTRAMUSCULAR | Status: DC | PRN
  Administered 2013-09-03 (×2): 50 ug via INTRAVENOUS
  Administered 2013-09-03: 100 ug via INTRAVENOUS
  Administered 2013-09-03 (×2): 50 ug via INTRAVENOUS
  Administered 2013-09-03: 100 ug via INTRAVENOUS

## 2013-09-03 MED ORDER — ALUM & MAG HYDROXIDE-SIMETH 200-200-20 MG/5ML PO SUSP: 30.0000 mL | Freq: Four times a day (QID) | ORAL | Status: DC | PRN

## 2013-09-03 MED ORDER — DIAZEPAM 5 MG PO TABS
ORAL_TABLET | ORAL | Status: AC
  Filled 2013-09-03: qty 1

## 2013-09-03 MED ORDER — DEXAMETHASONE SODIUM PHOSPHATE 10 MG/ML IJ SOLN
INTRAMUSCULAR | Status: DC | PRN
  Administered 2013-09-03: 10 mg via INTRAVENOUS

## 2013-09-03 MED ORDER — LIDOCAINE HCL (CARDIAC) 20 MG/ML IV SOLN
INTRAVENOUS | Status: AC
  Filled 2013-09-03: qty 5

## 2013-09-03 MED ORDER — ONDANSETRON HCL 4 MG/2ML IJ SOLN: 4.0000 mg | INTRAMUSCULAR | Status: DC | PRN

## 2013-09-03 MED ORDER — MIDAZOLAM HCL 2 MG/2ML IJ SOLN
INTRAMUSCULAR | Status: AC
  Filled 2013-09-03: qty 2

## 2013-09-03 MED ORDER — HYDROCHLOROTHIAZIDE 50 MG PO TABS
50.0000 mg | ORAL_TABLET | Freq: Every day | ORAL | Status: DC
  Administered 2013-09-03: 50 mg via ORAL
  Filled 2013-09-03 (×2): qty 1

## 2013-09-03 MED ORDER — BUPROPION HCL ER (XL) 300 MG PO TB24
300.0000 mg | ORAL_TABLET | Freq: Every day | ORAL | Status: DC
  Filled 2013-09-03: qty 1

## 2013-09-03 MED ORDER — KCL IN DEXTROSE-NACL 20-5-0.45 MEQ/L-%-% IV SOLN
INTRAVENOUS | Status: DC
  Filled 2013-09-03 (×3): qty 1000

## 2013-09-03 MED ORDER — FENTANYL CITRATE 0.05 MG/ML IJ SOLN
INTRAMUSCULAR | Status: AC
  Filled 2013-09-03: qty 5

## 2013-09-03 MED ORDER — PROPOFOL 10 MG/ML IV BOLUS
INTRAVENOUS | Status: DC | PRN
  Administered 2013-09-03: 140 mg via INTRAVENOUS

## 2013-09-03 MED ORDER — HYDROMORPHONE HCL PF 1 MG/ML IJ SOLN
0.2500 mg | INTRAMUSCULAR | Status: DC | PRN
  Administered 2013-09-03 (×4): 0.5 mg via INTRAVENOUS

## 2013-09-03 MED ORDER — ROCURONIUM BROMIDE 100 MG/10ML IV SOLN
INTRAVENOUS | Status: DC | PRN
  Administered 2013-09-03: 50 mg via INTRAVENOUS

## 2013-09-03 MED ORDER — FLEET ENEMA 7-19 GM/118ML RE ENEM
1.0000 | ENEMA | Freq: Once | RECTAL | Status: AC | PRN
  Filled 2013-09-03: qty 1

## 2013-09-03 MED ORDER — THYROID 60 MG PO TABS
90.0000 mg | ORAL_TABLET | Freq: Every day | ORAL | Status: DC
  Administered 2013-09-04: 90 mg via ORAL
  Filled 2013-09-03 (×2): qty 1

## 2013-09-03 MED ORDER — PHENYLEPHRINE HCL 10 MG/ML IJ SOLN
INTRAMUSCULAR | Status: DC | PRN
  Administered 2013-09-03: 80 ug via INTRAVENOUS
  Administered 2013-09-03 (×2): 120 ug via INTRAVENOUS
  Administered 2013-09-03 (×4): 80 ug via INTRAVENOUS
  Administered 2013-09-03: 120 ug via INTRAVENOUS
  Administered 2013-09-03: 80 ug via INTRAVENOUS

## 2013-09-03 MED ORDER — HYDROMORPHONE HCL PF 1 MG/ML IJ SOLN
INTRAMUSCULAR | Status: AC
  Filled 2013-09-03: qty 1

## 2013-09-03 MED ORDER — SODIUM CHLORIDE 0.9 % IJ SOLN
3.0000 mL | Freq: Two times a day (BID) | INTRAMUSCULAR | Status: DC
  Administered 2013-09-03: 3 mL via INTRAVENOUS

## 2013-09-03 MED ORDER — PHENYLEPHRINE 40 MCG/ML (10ML) SYRINGE FOR IV PUSH (FOR BLOOD PRESSURE SUPPORT)
PREFILLED_SYRINGE | INTRAVENOUS | Status: AC
  Filled 2013-09-03: qty 10

## 2013-09-03 MED ORDER — ACETAMINOPHEN 650 MG RE SUPP: 650.0000 mg | RECTAL | Status: DC | PRN

## 2013-09-03 MED ORDER — LACTATED RINGERS IV SOLN
INTRAVENOUS | Status: DC | PRN
  Administered 2013-09-03 (×2): via INTRAVENOUS

## 2013-09-03 MED ORDER — MORPHINE SULFATE 2 MG/ML IJ SOLN
1.0000 mg | INTRAMUSCULAR | Status: DC | PRN
  Administered 2013-09-03 (×3): 4 mg via INTRAVENOUS
  Filled 2013-09-03 (×3): qty 2

## 2013-09-03 MED ORDER — MENTHOL 3 MG MT LOZG
1.0000 | LOZENGE | OROMUCOSAL | Status: DC | PRN
Start: 2013-09-03 — End: 2013-09-04

## 2013-09-03 MED ORDER — 0.9 % SODIUM CHLORIDE (POUR BTL) OPTIME
TOPICAL | Status: DC | PRN
  Administered 2013-09-03: 1000 mL

## 2013-09-03 MED ORDER — SODIUM CHLORIDE 0.9 % IJ SOLN
3.0000 mL | INTRAMUSCULAR | Status: DC | PRN
Start: 2013-09-03 — End: 2013-09-04

## 2013-09-03 MED ORDER — BUPIVACAINE HCL (PF) 0.5 % IJ SOLN
INTRAMUSCULAR | Status: DC | PRN
  Administered 2013-09-03: 2.5 mL

## 2013-09-03 MED ORDER — CEFAZOLIN SODIUM 1-5 GM-% IV SOLN
1.0000 g | Freq: Three times a day (TID) | INTRAVENOUS | Status: AC
  Administered 2013-09-03 (×2): 1 g via INTRAVENOUS
  Filled 2013-09-03 (×2): qty 50

## 2013-09-03 MED ORDER — SENNOSIDES-DOCUSATE SODIUM 8.6-50 MG PO TABS: 1.0000 | ORAL_TABLET | Freq: Every day | ORAL | Status: DC

## 2013-09-03 MED ORDER — LIDOCAINE-EPINEPHRINE 1 %-1:100000 IJ SOLN
INTRAMUSCULAR | Status: DC | PRN
  Administered 2013-09-03: 2.5 mL

## 2013-09-03 MED ORDER — THROMBIN 5000 UNITS EX SOLR
CUTANEOUS | Status: DC | PRN
  Administered 2013-09-03 (×2): 5000 [IU] via TOPICAL

## 2013-09-03 MED ORDER — NEOSTIGMINE METHYLSULFATE 1 MG/ML IJ SOLN
INTRAMUSCULAR | Status: DC | PRN
  Administered 2013-09-03: 4 mg via INTRAVENOUS

## 2013-09-03 MED ORDER — SODIUM CHLORIDE 0.9 % IV SOLN: 250.0000 mL | INTRAVENOUS | Status: DC

## 2013-09-03 MED ORDER — ROCURONIUM BROMIDE 50 MG/5ML IV SOLN
INTRAVENOUS | Status: AC
  Filled 2013-09-03: qty 1

## 2013-09-03 MED ORDER — ACETAMINOPHEN 325 MG PO TABS: 650.0000 mg | ORAL_TABLET | ORAL | Status: DC | PRN

## 2013-09-03 MED ORDER — BISACODYL 10 MG RE SUPP: 10.0000 mg | Freq: Every day | RECTAL | Status: DC | PRN

## 2013-09-03 MED ORDER — HYDROCODONE-ACETAMINOPHEN 5-325 MG PO TABS
1.0000 | ORAL_TABLET | ORAL | Status: DC | PRN
Start: 2013-09-03 — End: 2013-09-04

## 2013-09-03 MED ORDER — ZOLPIDEM TARTRATE 5 MG PO TABS: 5.0000 mg | ORAL_TABLET | Freq: Every evening | ORAL | Status: DC | PRN

## 2013-09-03 MED ORDER — MIDAZOLAM HCL 5 MG/5ML IJ SOLN
INTRAMUSCULAR | Status: DC | PRN
  Administered 2013-09-03: 2 mg via INTRAVENOUS

## 2013-09-03 MED ORDER — DIAZEPAM 5 MG PO TABS
5.0000 mg | ORAL_TABLET | Freq: Four times a day (QID) | ORAL | Status: DC | PRN
  Administered 2013-09-03 – 2013-09-04 (×3): 5 mg via ORAL
  Filled 2013-09-03 (×2): qty 1

## 2013-09-03 MED ORDER — GLYCOPYRROLATE 0.2 MG/ML IJ SOLN
INTRAMUSCULAR | Status: DC | PRN
  Administered 2013-09-03: .6 mg via INTRAVENOUS

## 2013-09-03 MED ORDER — SENNA 8.6 MG PO TABS
1.0000 | ORAL_TABLET | Freq: Two times a day (BID) | ORAL | Status: DC
  Administered 2013-09-03: 8.6 mg via ORAL
  Filled 2013-09-03 (×2): qty 1

## 2013-09-03 MED ORDER — ARTIFICIAL TEARS OP OINT
TOPICAL_OINTMENT | OPHTHALMIC | Status: DC | PRN
  Administered 2013-09-03: 1 via OPHTHALMIC

## 2013-09-03 MED ORDER — ONDANSETRON HCL 4 MG/2ML IJ SOLN
INTRAMUSCULAR | Status: DC | PRN
  Administered 2013-09-03: 4 mg via INTRAVENOUS

## 2013-09-03 MED ORDER — THYROID 60 MG PO TABS: 90.0000 mg | ORAL_TABLET | Freq: Every day | ORAL | Status: DC

## 2013-09-03 MED ORDER — DEXAMETHASONE SODIUM PHOSPHATE 10 MG/ML IJ SOLN
INTRAMUSCULAR | Status: AC
  Filled 2013-09-03: qty 1

## 2013-09-03 MED ORDER — LIDOCAINE HCL (CARDIAC) 20 MG/ML IV SOLN
INTRAVENOUS | Status: DC | PRN
  Administered 2013-09-03: 80 mg via INTRAVENOUS

## 2013-09-03 MED ORDER — PROPOFOL 10 MG/ML IV BOLUS
INTRAVENOUS | Status: AC
  Filled 2013-09-03: qty 20

## 2013-09-03 MED ORDER — PHENOL 1.4 % MT LIQD: 1.0000 | OROMUCOSAL | Status: DC | PRN

## 2013-09-03 MED ORDER — ONDANSETRON HCL 4 MG/2ML IJ SOLN
INTRAMUSCULAR | Status: AC
  Filled 2013-09-03: qty 2

## 2013-09-03 MED ORDER — POLYETHYLENE GLYCOL 3350 17 G PO PACK
17.0000 g | PACK | Freq: Every day | ORAL | Status: DC | PRN
  Filled 2013-09-03: qty 1

## 2013-09-03 MED ORDER — OXYCODONE-ACETAMINOPHEN 5-325 MG PO TABS
1.0000 | ORAL_TABLET | ORAL | Status: DC | PRN
  Administered 2013-09-03 – 2013-09-04 (×5): 2 via ORAL
  Filled 2013-09-03 (×6): qty 2

## 2013-09-03 MED ORDER — HEMOSTATIC AGENTS (NO CHARGE) OPTIME
TOPICAL | Status: DC | PRN
  Administered 2013-09-03: 1 via TOPICAL

## 2013-09-03 MED ORDER — PANTOPRAZOLE SODIUM 40 MG IV SOLR
40.0000 mg | Freq: Every day | INTRAVENOUS | Status: DC
  Administered 2013-09-03: 40 mg via INTRAVENOUS
  Filled 2013-09-03 (×2): qty 40

## 2013-09-03 MED ORDER — DOCUSATE SODIUM 100 MG PO CAPS
100.0000 mg | ORAL_CAPSULE | Freq: Two times a day (BID) | ORAL | Status: DC
  Administered 2013-09-03 (×2): 100 mg via ORAL
  Filled 2013-09-03 (×4): qty 1

## 2013-09-03 SURGICAL SUPPLY — 68 items
ADH SKN CLS APL DERMABOND .7 (GAUZE/BANDAGES/DRESSINGS) ×1
ALLOGRAFT TRIAD LORDOTIC CC (Bone Implant) ×2 IMPLANT
APL SKNCLS STERI-STRIP NONHPOA (GAUZE/BANDAGES/DRESSINGS)
BAG DECANTER FOR FLEXI CONT (MISCELLANEOUS) ×2 IMPLANT
BANDAGE GAUZE ELAST BULKY 4 IN (GAUZE/BANDAGES/DRESSINGS) IMPLANT
BENZOIN TINCTURE PRP APPL 2/3 (GAUZE/BANDAGES/DRESSINGS) IMPLANT
BIT DRILL NEURO 2X3.1 SFT TUCH (MISCELLANEOUS) ×1 IMPLANT
BLADE ULTRA TIP 2M (BLADE) IMPLANT
BUR BARREL STRAIGHT FLUTE 4.0 (BURR) ×2 IMPLANT
CANISTER SUCT 3000ML (MISCELLANEOUS) ×2 IMPLANT
CONT SPEC 4OZ CLIKSEAL STRL BL (MISCELLANEOUS) ×2 IMPLANT
COVER MAYO STAND STRL (DRAPES) ×2 IMPLANT
DERMABOND ADVANCED (GAUZE/BANDAGES/DRESSINGS) ×1
DERMABOND ADVANCED .7 DNX12 (GAUZE/BANDAGES/DRESSINGS) ×1 IMPLANT
DRAPE LAPAROTOMY 100X72 PEDS (DRAPES) ×2 IMPLANT
DRAPE MICROSCOPE LEICA (MISCELLANEOUS) IMPLANT
DRAPE POUCH INSTRU U-SHP 10X18 (DRAPES) ×2 IMPLANT
DRAPE PROXIMA HALF (DRAPES) ×1 IMPLANT
DRESSING TELFA 8X3 (GAUZE/BANDAGES/DRESSINGS) IMPLANT
DRILL NEURO 2X3.1 SOFT TOUCH (MISCELLANEOUS) ×2
DURAPREP 6ML APPLICATOR 50/CS (WOUND CARE) ×2 IMPLANT
ELECT COATED BLADE 2.86 ST (ELECTRODE) ×2 IMPLANT
ELECT REM PT RETURN 9FT ADLT (ELECTROSURGICAL) ×2
ELECTRODE REM PT RTRN 9FT ADLT (ELECTROSURGICAL) ×1 IMPLANT
GAUZE SPONGE 4X4 16PLY XRAY LF (GAUZE/BANDAGES/DRESSINGS) IMPLANT
GLOVE BIO SURGEON STRL SZ8 (GLOVE) ×2 IMPLANT
GLOVE BIOGEL PI IND STRL 8 (GLOVE) ×1 IMPLANT
GLOVE BIOGEL PI IND STRL 8.5 (GLOVE) ×1 IMPLANT
GLOVE BIOGEL PI INDICATOR 8 (GLOVE) ×1
GLOVE BIOGEL PI INDICATOR 8.5 (GLOVE) ×1
GLOVE ECLIPSE 8.0 STRL XLNG CF (GLOVE) ×2 IMPLANT
GLOVE EXAM NITRILE LRG STRL (GLOVE) IMPLANT
GLOVE EXAM NITRILE MD LF STRL (GLOVE) IMPLANT
GLOVE EXAM NITRILE XL STR (GLOVE) IMPLANT
GLOVE EXAM NITRILE XS STR PU (GLOVE) IMPLANT
GOWN BRE IMP SLV AUR LG STRL (GOWN DISPOSABLE) IMPLANT
GOWN BRE IMP SLV AUR XL STRL (GOWN DISPOSABLE) ×1 IMPLANT
GOWN STRL REIN 2XL LVL4 (GOWN DISPOSABLE) ×1 IMPLANT
GOWN STRL REUS W/ TWL XL LVL3 (GOWN DISPOSABLE) IMPLANT
GOWN STRL REUS W/TWL XL LVL3 (GOWN DISPOSABLE) ×6
HEAD HALTER (SOFTGOODS) ×2 IMPLANT
KIT BASIN OR (CUSTOM PROCEDURE TRAY) ×2 IMPLANT
KIT ROOM TURNOVER OR (KITS) ×2 IMPLANT
NDL HYPO 18GX1.5 BLUNT FILL (NEEDLE) ×1 IMPLANT
NDL HYPO 25X1 1.5 SAFETY (NEEDLE) ×1 IMPLANT
NDL SPNL 22GX3.5 QUINCKE BK (NEEDLE) ×1 IMPLANT
NEEDLE HYPO 18GX1.5 BLUNT FILL (NEEDLE) ×2 IMPLANT
NEEDLE HYPO 25X1 1.5 SAFETY (NEEDLE) ×2 IMPLANT
NEEDLE SPNL 22GX3.5 QUINCKE BK (NEEDLE) ×2 IMPLANT
NS IRRIG 1000ML POUR BTL (IV SOLUTION) ×2 IMPLANT
PACK LAMINECTOMY NEURO (CUSTOM PROCEDURE TRAY) ×2 IMPLANT
PAD ARMBOARD 7.5X6 YLW CONV (MISCELLANEOUS) ×2 IMPLANT
PIN DISTRACTION 14MM (PIN) ×4 IMPLANT
PLATE ARCHON 2-LEVEL 36MM (Plate) ×1 IMPLANT
RUBBERBAND STERILE (MISCELLANEOUS) IMPLANT
SCREW ARCHON SELFTAP 4.0X13 (Screw) ×12 IMPLANT
SPONGE GAUZE 4X4 12PLY (GAUZE/BANDAGES/DRESSINGS) IMPLANT
SPONGE INTESTINAL PEANUT (DISPOSABLE) ×2 IMPLANT
SPONGE SURGIFOAM ABS GEL SZ50 (HEMOSTASIS) IMPLANT
STAPLER SKIN PROX WIDE 3.9 (STAPLE) IMPLANT
STRIP CLOSURE SKIN 1/2X4 (GAUZE/BANDAGES/DRESSINGS) IMPLANT
SUT VIC AB 3-0 SH 8-18 (SUTURE) ×3 IMPLANT
SYR 20ML ECCENTRIC (SYRINGE) ×2 IMPLANT
SYR 3ML LL SCALE MARK (SYRINGE) ×2 IMPLANT
TOWEL OR 17X24 6PK STRL BLUE (TOWEL DISPOSABLE) ×2 IMPLANT
TOWEL OR 17X26 10 PK STRL BLUE (TOWEL DISPOSABLE) ×2 IMPLANT
TRAP SPECIMEN MUCOUS 40CC (MISCELLANEOUS) IMPLANT
WATER STERILE IRR 1000ML POUR (IV SOLUTION) ×2 IMPLANT

## 2013-09-03 NOTE — Anesthesia Preprocedure Evaluation (Addendum)
Anesthesia Evaluation  Patient identified by MRN, date of birth, ID band Patient awake    Reviewed: Allergy & Precautions, NPO status , Patient's Chart, lab work & pertinent test results  Airway Mallampati: II      Dental   Pulmonary former smoker,  breath sounds clear to auscultation        Cardiovascular negative cardio ROS  Rhythm:Regular Rate:Normal     Neuro/Psych    GI/Hepatic Neg liver ROS, GERD-  ,  Endo/Other  Hypothyroidism   Renal/GU negative Renal ROS     Musculoskeletal   Abdominal   Peds  Hematology   Anesthesia Other Findings   Reproductive/Obstetrics                          Anesthesia Physical Anesthesia Plan  ASA: II  Anesthesia Plan: General   Post-op Pain Management:    Induction: Intravenous  Airway Management Planned: Oral ETT  Additional Equipment:   Intra-op Plan:   Post-operative Plan: Extubation in OR  Informed Consent: I have reviewed the patients History and Physical, chart, labs and discussed the procedure including the risks, benefits and alternatives for the proposed anesthesia with the patient or authorized representative who has indicated his/her understanding and acceptance.     Plan Discussed with: CRNA, Anesthesiologist and Surgeon  Anesthesia Plan Comments:         Anesthesia Quick Evaluation

## 2013-09-03 NOTE — Transfer of Care (Signed)
Immediate Anesthesia Transfer of Care Note  Patient: Kathreen Cornfield  Procedure(s) Performed: Procedure(s) with comments: ANTERIOR CERVICAL DECOMPRESSION/DISCECTOMY FUSION 2 LEVELS (N/A) - C4-5 C5-6 Anterior cervical decompression/diskectomy/fusion  Patient Location: PACU  Anesthesia Type:General  Level of Consciousness: awake and oriented  Airway & Oxygen Therapy: Patient Spontanous Breathing and Patient connected to nasal cannula oxygen  Post-op Assessment: Report given to PACU RN, Post -op Vital signs reviewed and stable and Patient moving all extremities X 4  Post vital signs: Reviewed and stable  Complications: No apparent anesthesia complications

## 2013-09-03 NOTE — Preoperative (Signed)
Beta Blockers   Reason not to administer Beta Blockers:Not Applicable 

## 2013-09-03 NOTE — Interval H&P Note (Signed)
History and Physical Interval Note:  09/03/2013 7:11 AM  Rose Boyer  has presented today for surgery, with the diagnosis of Cervical radiculopathy, Cervical degenerative disc disease, Cervicalgia, Cervical hnp without myelopathy  The various methods of treatment have been discussed with the patient and family. After consideration of risks, benefits and other options for treatment, the patient has consented to  Procedure(s) with comments: ANTERIOR CERVICAL DECOMPRESSION/DISCECTOMY FUSION 2 LEVELS (N/A) - C4-5 C5-6 Anterior cervical decompression/diskectomy/fusion as a surgical intervention .  The patient's history has been reviewed, patient examined, no change in status, stable for surgery.  I have reviewed the patient's chart and labs.  Questions were answered to the patient's satisfaction.     Sindy Mccune D

## 2013-09-03 NOTE — Anesthesia Postprocedure Evaluation (Signed)
  Anesthesia Post-op Note  Patient: Rose Boyer  Procedure(s) Performed: Procedure(s) with comments: ANTERIOR CERVICAL DECOMPRESSION/DISCECTOMY FUSION 2 LEVELS (N/A) - C4-5 C5-6 Anterior cervical decompression/diskectomy/fusion  Patient Location: PACU  Anesthesia Type:General  Level of Consciousness: awake  Airway and Oxygen Therapy: Patient Spontanous Breathing  Post-op Pain: mild  Post-op Assessment: Post-op Vital signs reviewed  Post-op Vital Signs: Reviewed  Complications: No apparent anesthesia complications

## 2013-09-03 NOTE — Evaluation (Signed)
Physical Therapy Evaluation Patient Details Name: Rose Boyer MRN: [id number] DOB: 08-11-1955 Today's Date: 09/03/2013 Time: 6195-0932 PT Time Calculation (min): 33 min  PT Assessment / Plan / Recommendation History of Present Illness  ANTERIOR CERVICAL DECOMPRESSION/DISCECTOMY FUSION 2 LEVELS (N/A) - C4-5 C5-6 Anterior cervical decompression/diskectomy/fusion with allograft, autograft, plate  Clinical Impression  Pt admitted with/for 2 level cervical fusion.  All education completed incl bed mobility, lifting restrictions, progression of activity.  Pt currently limited functionally due to the problems listed below.  (see problems list.)  Pt will benefit from PT to maximize function and safety to be able to get home safely with available assist of family.     PT Assessment  Patent does not need any further PT services    Follow Up Recommendations  No PT follow up    Does the patient have the potential to tolerate intense rehabilitation      Barriers to Discharge        Equipment Recommendations  None recommended by PT    Recommendations for Other Services     Frequency      Precautions / Restrictions Precautions Precautions: Cervical Precaution Comments: cervical collar for a month Required Braces or Orthoses: Cervical Brace Cervical Brace: Soft collar   Pertinent Vitals/Pain       Mobility  Bed Mobility Overal bed mobility: Independent Transfers Overall transfer level: Independent Ambulation/Gait Ambulation/Gait assistance: Independent Ambulation Distance (Feet): 500 Feet Assistive device: None Gait Pattern/deviations: WFL(Within Functional Limits) Gait velocity interpretation: at or above normal speed for age/gender Stairs: Yes Stair Management: One rail Right;Alternating pattern;Forwards Number of Stairs: 8    Exercises     PT Diagnosis:    PT Problem List:   PT Treatment Interventions:       PT Goals(Current goals can be found in the care plan  section) Acute Rehab PT Goals PT Goal Formulation: No goals set, d/c therapy  Visit Information  Last PT Received On: 09/03/13 Assistance Needed: +1 History of Present Illness: ANTERIOR CERVICAL DECOMPRESSION/DISCECTOMY FUSION 2 LEVELS (N/A) - C4-5 C5-6 Anterior cervical decompression/diskectomy/fusion with allograft, autograft, plate       Prior Functioning  Home Living Family/patient expects to be discharged to:: Private residence Living Arrangements: Spouse/significant other Available Help at Discharge: Family Type of Home: House Home Access: Elevator Home Layout: Multi-level Home Equipment: None Prior Function Level of Independence: Independent Communication Communication: No difficulties    Cognition  Cognition Arousal/Alertness: Awake/alert Behavior During Therapy: WFL for tasks assessed/performed Overall Cognitive Status: Within Functional Limits for tasks assessed    Extremity/Trunk Assessment Upper Extremity Assessment Upper Extremity Assessment: Overall WFL for tasks assessed (moved against gravity) Lower Extremity Assessment Lower Extremity Assessment: Overall WFL for tasks assessed   Balance Balance Overall balance assessment: No apparent balance deficits (not formally assessed)  End of Session PT - End of Session Equipment Utilized During Treatment: Cervical collar Activity Tolerance: Patient tolerated treatment well Patient left: Other (comment) (walking in her room) Nurse Communication: Mobility status  GP Functional Assessment Tool Used: clinical judgement Functional Limitation: Mobility: Walking and moving around Mobility: Walking and Moving Around Current Status (I7124): 0 percent impaired, limited or restricted Mobility: Walking and Moving Around Goal Status (P8099): 0 percent impaired, limited or restricted Mobility: Walking and Moving Around Discharge Status [phone]): 0 percent impaired, limited or restricted   Casee Knepp, Tessie Fass 09/03/2013, 5:27  PM 09/03/2013  Donnella Sham, PT [phone]  (pager)

## 2013-09-03 NOTE — Op Note (Signed)
09/03/2013  10:01 AM  PATIENT:  Kathreen Cornfield  58 y.o. 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   PROCEDURE:  Procedure(s) with comments: ANTERIOR CERVICAL DECOMPRESSION/DISCECTOMY FUSION 2 LEVELS (N/A) - C4-5 C5-6 Anterior cervical decompression/diskectomy/fusion with allograft, autograft, plate  SURGEON:  Surgeon(s) and Role:    * Erline Levine, MD - Primary  PHYSICIAN ASSISTANT: Joya Salm, MD  ASSISTANTS: Poteat, RN   ANESTHESIA:   general  EBL:  Total I/O In: 2700 [I.V.:2700] Out: 30 [Blood:30]  BLOOD ADMINISTERED:none  DRAINS: none   LOCAL MEDICATIONS USED:  LIDOCAINE   SPECIMEN:  No Specimen  DISPOSITION OF SPECIMEN:  N/A  COUNTS:  YES  TOURNIQUET:  * No tourniquets in log *  DICTATION: DICTATION: Patient is 58 year old female with right arm pain and weakness with HNP, spondylosis, radiculopathy C 45 and C 56 levels  PROCEDURE: Patient was brought to operating room and following the smooth and uncomplicated induction of general endotracheal anesthesia her head was placed on a horseshoe head holder she was placed in 5 pounds of Holter traction and her anterior neck was prepped and draped in usual sterile fashion. An incision was made on the left side of midline after infiltrating the skin and subcutaneous tissues with local lidocaine. The platysmal layer was incised and subplatysmal dissection was performed exposing the anterior border sternocleidomastoid muscle. Using blunt dissection the carotid sheath was kept lateral and trachea and esophagus kept medial exposing the anterior cervical spine. A bent spinal needle was placed it was felt to be the C 45 and 56  levels and this was confirmed on intraoperative  x-ray. Longus coli muscles were taken down from the anterior cervical spine using electrocautery and key elevator and self-retaining retractor was placed exposing the C 45  and C 56 levels. The interspaces were incised and a thorough discectomy was performed. Distraction pins were placed. Initially the C 45 level was operated. Uncinate spurs and central spondylitic ridges were drilled down with a high-speed drill. The spinal cord dura and both C5 nerve roots were widely decompressed. Hemostasis was assured. After trial sizing a 101mm machined lordotic allograft bone wedge was selected and packed with local autograft. This was tamped into position and countersunk appropriately. Attention was the paid to the C5/6 level, where similar decompression was performed.  Uncinate spurs and central spondylitic ridges were drilled down with a high-speed drill. The spinal cord dura and both C6 nerve roots were widely decompressed. Hemostasis was assured. After trial sizing a 46mm machined lordotic allograft bone wedge was selected and packed with local autograft. This was tamped into position and countersunk appropriately. Distraction weight was removed. A 36 mm Nuvasive anterior cervical plate was affixed to the cervical spine with 13 mm variable-angle screws 2 at C4, 2 at C5 and 2 at C6. All screws were well-positioned and locking mechanisms were engaged. A final X ray was obtained which showed well positioned grafts and anterior plate without complicating features at the level of C 4. Soft tissues were inspected and found to be in good repair. The wound was irrigated. The platysma layer was closed with 3-0 Vicryl stitches and the skin was reapproximated with 3-0 Vicryl subcuticular stitches. The wound was dressed with Dermabond. Counts were correct at the end of the case. Patient  was extubated and taken to recovery in stable and satisfactory condition.    PLAN OF CARE: Admit for overnight observation  PATIENT DISPOSITION:   PACU - hemodynamically stable.   Delay start of Pharmacological VTE agent (>24hrs) due to surgical blood loss or risk of bleeding: yes  

## 2013-09-03 NOTE — Progress Notes (Signed)
Awake, alert, conversant.  Full bilateral deltoid, biceps, triceps, hand intrinsics.  MAEW.  Doing well.

## 2013-09-03 NOTE — Brief Op Note (Signed)
09/03/2013  10:01 AM  PATIENT:  Rose Boyer  58 y.o. 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   PROCEDURE:  Procedure(s) with comments: ANTERIOR CERVICAL DECOMPRESSION/DISCECTOMY FUSION 2 LEVELS (N/A) - C4-5 C5-6 Anterior cervical decompression/diskectomy/fusion with allograft, autograft, plate  SURGEON:  Surgeon(s) and Role:    * Erline Levine, MD - Primary  PHYSICIAN ASSISTANT: Joya Salm, MD  ASSISTANTS: Poteat, RN   ANESTHESIA:   general  EBL:  Total I/O In: 2700 [I.V.:2700] Out: 30 [Blood:30]  BLOOD ADMINISTERED:none  DRAINS: none   LOCAL MEDICATIONS USED:  LIDOCAINE   SPECIMEN:  No Specimen  DISPOSITION OF SPECIMEN:  N/A  COUNTS:  YES  TOURNIQUET:  * No tourniquets in log *  DICTATION: DICTATION: Patient is 58 year old female with right arm pain and weakness with HNP, spondylosis, radiculopathy C 45 and C 56 levels  PROCEDURE: Patient was brought to operating room and following the smooth and uncomplicated induction of general endotracheal anesthesia her head was placed on a horseshoe head holder she was placed in 5 pounds of Holter traction and her anterior neck was prepped and draped in usual sterile fashion. An incision was made on the left side of midline after infiltrating the skin and subcutaneous tissues with local lidocaine. The platysmal layer was incised and subplatysmal dissection was performed exposing the anterior border sternocleidomastoid muscle. Using blunt dissection the carotid sheath was kept lateral and trachea and esophagus kept medial exposing the anterior cervical spine. A bent spinal needle was placed it was felt to be the C 45 and 56  levels and this was confirmed on intraoperative  x-ray. Longus coli muscles were taken down from the anterior cervical spine using electrocautery and key elevator and self-retaining retractor was placed exposing the C 45  and C 56 levels. The interspaces were incised and a thorough discectomy was performed. Distraction pins were placed. Initially the C 45 level was operated. Uncinate spurs and central spondylitic ridges were drilled down with a high-speed drill. The spinal cord dura and both C5 nerve roots were widely decompressed. Hemostasis was assured. After trial sizing a 101mm machined lordotic allograft bone wedge was selected and packed with local autograft. This was tamped into position and countersunk appropriately. Attention was the paid to the C5/6 level, where similar decompression was performed.  Uncinate spurs and central spondylitic ridges were drilled down with a high-speed drill. The spinal cord dura and both C6 nerve roots were widely decompressed. Hemostasis was assured. After trial sizing a 46mm machined lordotic allograft bone wedge was selected and packed with local autograft. This was tamped into position and countersunk appropriately. Distraction weight was removed. A 36 mm Nuvasive anterior cervical plate was affixed to the cervical spine with 13 mm variable-angle screws 2 at C4, 2 at C5 and 2 at C6. All screws were well-positioned and locking mechanisms were engaged. A final X ray was obtained which showed well positioned grafts and anterior plate without complicating features at the level of C 4. Soft tissues were inspected and found to be in good repair. The wound was irrigated. The platysma layer was closed with 3-0 Vicryl stitches and the skin was reapproximated with 3-0 Vicryl subcuticular stitches. The wound was dressed with Dermabond. Counts were correct at the end of the case. Patient  was extubated and taken to recovery in stable and satisfactory condition.    PLAN OF CARE: Admit for overnight observation  PATIENT DISPOSITION:   PACU - hemodynamically stable.   Delay start of Pharmacological VTE agent (>24hrs) due to surgical blood loss or risk of bleeding: yes

## 2013-09-04 MED ORDER — OXYCODONE-ACETAMINOPHEN 5-325 MG PO TABS: 1.0000 | ORAL_TABLET | ORAL | Status: DC | PRN

## 2013-09-04 MED ORDER — METHOCARBAMOL 500 MG PO TABS: 500.0000 mg | ORAL_TABLET | Freq: Four times a day (QID) | ORAL | Status: DC

## 2013-09-04 NOTE — Discharge Instructions (Signed)

## 2013-09-04 NOTE — Progress Notes (Signed)
Subjective: Patient reports doing great  Objective: Vital signs in last 24 hours: Temp:  [97.1 F (36.2 C)-98.1 F (36.7 C)] 97.5 F (36.4 C) (02/11 0257) Pulse Rate:  [94-110] 94 (02/11 0257) Resp:  [12-22] 16 (02/11 0257) BP: (96-149)/(58-88) 96/65 mmHg (02/11 0257) SpO2:  [93 %-98 %] 93 % (02/11 0257)  Intake/Output from previous day: 02/10 0701 - 02/11 0700 In: 4380 [P.O.:1680; I.V.:2700] Out: 30 [Blood:30] Intake/Output this shift:    Physical Exam: Full strength Bilateral D/B/T/HI.  Walking, minimal pain.  Dressing CDI.  Lab Results: No results found for this basename: WBC, HGB, HCT, PLT,  in the last 72 hours BMET No results found for this basename: NA, K, CL, CO2, GLUCOSE, BUN, CREATININE, CALCIUM,  in the last 72 hours  Studies/Results: Dg Cervical Spine 2-3 Views  09/03/2013   CLINICAL DATA:  Fusion C4-5 and C5-6.  EXAM: CERVICAL SPINE - 2-3 VIEW  COMPARISON:  Cervical spine radiographs 11/27/2012  FINDINGS: Three portable cross-table lateral views of the cervical spine are submitted. Proximal aspect of an endotracheal tube is noted.  On image labeled "number 1", the cervical spine is visualized from the skullbase through the superior endplate of C4.  On image labeled "number 2", the cervical spine is imaged from the skullbase through the C4-C5 disc space. Disc space narrowing is noted at C4-C5.  On imaged labeled "number 3", there are postsurgical changes of anterior fusion at C4-C5. The C5 vertebral body is nearly completely obscured by the patient shoulders. The C5-6 level is not visualized.  IMPRESSION: Intraoperative cross-table lateral views of the cervical spine as detailed above. The inferior aspect of the cervical spine is not visualized due to obscuration by the patient's shoulders.   Electronically Signed   By: Curlene Dolphin M.D.   On: 09/03/2013 09:43    Assessment/Plan: Doing well.  D/C Home    LOS: 1 day    Peggyann Shoals, MD 09/04/2013, 7:59 AM

## 2013-09-04 NOTE — Progress Notes (Signed)
Pt given D/C instructions with Rx's, verbal understanding of teaching was given. Pt D/C'd home via walking @ 0940 per MD order. Pt was stable @ D/C and had no other needs. Holli Humbles, RN

## 2013-09-04 NOTE — Discharge Summary (Signed)
Physician Discharge Summary  Patient ID: Rose Boyer MRN: [id number] DOB/AGE: [birth year] 60 y.o.  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 rate 16, SpO2 93.00%. Neurologic: Alert and oriented X 3, normal strength and tone. Normal symmetric reflexes. Normal coordination and gait Wound:CDI  Disposition: Home     Medication List    STOP taking these medications       ibuprofen 200 MG tablet  Commonly known as:  ADVIL,MOTRIN      TAKE these medications       buPROPion 300 MG 24 hr tablet  Commonly known as:  WELLBUTRIN XL  Take 300 mg by mouth daily.     hydrochlorothiazide 50 MG tablet  Commonly known as:  HYDRODIURIL  Take 50 mg by mouth daily.     methocarbamol 500 MG tablet  Commonly known as:  ROBAXIN  Take 1 tablet (500 mg total) by mouth 4 (four) times daily.     oxyCODONE-acetaminophen 5-325 MG per tablet  Commonly known as:  PERCOCET/ROXICET  Take 1-2 tablets by mouth every 4 (four) hours as needed for moderate pain or severe pain.     senna-docusate 8.6-50 MG per tablet  Commonly known as:  Senokot-S  Take 1 tablet by mouth daily.     thyroid 90 MG tablet  Commonly known as:  ARMOUR  Take 90 mg by mouth daily.          Signed: Peggyann Shoals, MD 09/04/2013, 8:00 AM

## 2013-09-05 ENCOUNTER — Encounter (HOSPITAL_COMMUNITY): Payer: Self-pay | Admitting: Neurosurgery

## 2014-03-12 ENCOUNTER — Other Ambulatory Visit: Payer: Self-pay | Admitting: Obstetrics & Gynecology

## 2014-03-13 LAB — CYTOLOGY - PAP

## 2014-04-02 ENCOUNTER — Ambulatory Visit: Payer: 59 | Attending: Neurosurgery | Admitting: Physical Therapy

## 2014-04-02 DIAGNOSIS — IMO0001 Reserved for inherently not codable concepts without codable children: Secondary | ICD-10-CM | POA: Diagnosis not present

## 2014-04-02 DIAGNOSIS — M542 Cervicalgia: Secondary | ICD-10-CM | POA: Diagnosis not present

## 2014-04-02 DIAGNOSIS — M25659 Stiffness of unspecified hip, not elsewhere classified: Secondary | ICD-10-CM | POA: Insufficient documentation

## 2014-04-30 ENCOUNTER — Ambulatory Visit: Payer: 59 | Attending: Neurosurgery | Admitting: Physical Therapy

## 2014-04-30 DIAGNOSIS — M25659 Stiffness of unspecified hip, not elsewhere classified: Secondary | ICD-10-CM | POA: Diagnosis not present

## 2014-04-30 DIAGNOSIS — M542 Cervicalgia: Secondary | ICD-10-CM | POA: Insufficient documentation

## 2014-04-30 DIAGNOSIS — Z5189 Encounter for other specified aftercare: Secondary | ICD-10-CM | POA: Insufficient documentation

## 2014-05-14 ENCOUNTER — Ambulatory Visit: Payer: 59

## 2014-05-14 DIAGNOSIS — Z5189 Encounter for other specified aftercare: Secondary | ICD-10-CM | POA: Diagnosis not present

## 2014-05-19 ENCOUNTER — Other Ambulatory Visit (HOSPITAL_BASED_OUTPATIENT_CLINIC_OR_DEPARTMENT_OTHER): Payer: Self-pay | Admitting: Neurosurgery

## 2014-05-19 ENCOUNTER — Ambulatory Visit (HOSPITAL_BASED_OUTPATIENT_CLINIC_OR_DEPARTMENT_OTHER)
Admission: RE | Admit: 2014-05-19 | Discharge: 2014-05-19 | Disposition: A | Discharge status: Home / Self Care | Payer: 59 | Source: Ambulatory Visit | Attending: Obstetrics & Gynecology | Admitting: Obstetrics & Gynecology

## 2014-05-19 ENCOUNTER — Other Ambulatory Visit (HOSPITAL_BASED_OUTPATIENT_CLINIC_OR_DEPARTMENT_OTHER): Payer: Self-pay | Admitting: Obstetrics & Gynecology

## 2014-05-19 ENCOUNTER — Ambulatory Visit (HOSPITAL_BASED_OUTPATIENT_CLINIC_OR_DEPARTMENT_OTHER)
Admission: RE | Admit: 2014-05-19 | Discharge: 2014-05-19 | Disposition: A | Discharge status: Home / Self Care | Payer: 59 | Source: Ambulatory Visit | Attending: Neurosurgery | Admitting: Neurosurgery

## 2014-05-19 DIAGNOSIS — M5412 Radiculopathy, cervical region: Secondary | ICD-10-CM

## 2014-05-19 DIAGNOSIS — M5010 Cervical disc disorder with radiculopathy, unspecified cervical region: Secondary | ICD-10-CM | POA: Diagnosis not present

## 2014-05-19 DIAGNOSIS — Z1231 Encounter for screening mammogram for malignant neoplasm of breast: Secondary | ICD-10-CM

## 2014-05-20 ENCOUNTER — Ambulatory Visit: Payer: 59

## 2014-05-21 ENCOUNTER — Ambulatory Visit (HOSPITAL_BASED_OUTPATIENT_CLINIC_OR_DEPARTMENT_OTHER)
Admission: RE | Admit: 2014-05-21 | Discharge: 2014-05-21 | Disposition: A | Discharge status: Home / Self Care | Payer: 59 | Source: Ambulatory Visit | Attending: Neurosurgery | Admitting: Neurosurgery

## 2014-05-21 ENCOUNTER — Ambulatory Visit (HOSPITAL_BASED_OUTPATIENT_CLINIC_OR_DEPARTMENT_OTHER): Payer: 59

## 2014-05-21 ENCOUNTER — Ambulatory Visit: Payer: 59

## 2014-05-21 DIAGNOSIS — M5032 Other cervical disc degeneration, mid-cervical region: Secondary | ICD-10-CM | POA: Diagnosis not present

## 2014-05-21 DIAGNOSIS — M25511 Pain in right shoulder: Secondary | ICD-10-CM | POA: Diagnosis present

## 2014-05-21 DIAGNOSIS — M4802 Spinal stenosis, cervical region: Secondary | ICD-10-CM | POA: Diagnosis not present

## 2014-05-21 DIAGNOSIS — M2578 Osteophyte, vertebrae: Secondary | ICD-10-CM | POA: Insufficient documentation

## 2014-05-21 DIAGNOSIS — M5412 Radiculopathy, cervical region: Secondary | ICD-10-CM

## 2014-05-26 ENCOUNTER — Encounter (HOSPITAL_COMMUNITY): Payer: Self-pay | Admitting: Neurosurgery

## 2014-05-26 ENCOUNTER — Ambulatory Visit: Payer: 59

## 2014-05-28 ENCOUNTER — Ambulatory Visit: Payer: 59

## 2014-06-02 ENCOUNTER — Ambulatory Visit: Payer: 59

## 2014-06-05 ENCOUNTER — Ambulatory Visit: Payer: 59

## 2014-06-09 ENCOUNTER — Ambulatory Visit: Payer: 59

## 2014-06-12 ENCOUNTER — Ambulatory Visit: Payer: 59 | Admitting: Rehabilitation

## 2014-07-09 ENCOUNTER — Other Ambulatory Visit: Payer: Self-pay | Admitting: Obstetrics & Gynecology

## 2014-07-09 DIAGNOSIS — Z780 Asymptomatic menopausal state: Secondary | ICD-10-CM

## 2014-07-23 ENCOUNTER — Ambulatory Visit (INDEPENDENT_AMBULATORY_CARE_PROVIDER_SITE_OTHER): Payer: 59

## 2014-07-23 DIAGNOSIS — Z13820 Encounter for screening for osteoporosis: Secondary | ICD-10-CM

## 2014-07-23 DIAGNOSIS — Z780 Asymptomatic menopausal state: Secondary | ICD-10-CM

## 2014-07-28 ENCOUNTER — Telehealth: Payer: Self-pay | Admitting: *Deleted

## 2014-07-28 NOTE — Telephone Encounter (Signed)
I spoke with Rose Boyer and gave her the DEXA scan results

## 2014-07-28 NOTE — Telephone Encounter (Signed)
-----   Message from Lanice Shirts, MD sent at 07/27/2014  9:16 AM EST ----- Rose Boyer  Call [facility] and let her know that her bone density test is normal.  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

## 2014-08-26 ENCOUNTER — Emergency Department
Admission: EM | Admit: 2014-08-26 | Discharge: 2014-08-26 | Disposition: A | Discharge status: Home / Self Care | Payer: 59 | Source: Home / Self Care | Attending: Emergency Medicine | Admitting: Emergency Medicine

## 2014-08-26 ENCOUNTER — Encounter: Payer: Self-pay | Admitting: *Deleted

## 2014-08-26 DIAGNOSIS — J0100 Acute maxillary sinusitis, unspecified: Secondary | ICD-10-CM

## 2014-08-26 MED ORDER — CEFDINIR 300 MG PO CAPS: 300.0000 mg | ORAL_CAPSULE | Freq: Two times a day (BID) | ORAL | Status: DC

## 2014-08-26 NOTE — ED Notes (Signed)
Pt c/o nasal congestion, runny nose, HA and a little cough x 5 days. Denies fever. She has taken Sudafed and Dayquil.

## 2014-08-26 NOTE — ED Provider Notes (Signed)
CSN: [id number]     Arrival date & time 08/26/14  1123 History   First MD Initiated Contact with Patient 08/26/14 1140     Chief Complaint  Patient presents with  . Nasal Congestion  . Headache   (Consider location/radiation/quality/duration/timing/severity/associated sxs/prior Treatment) HPI SINUSITIS  Onset:6 days Facial/sinus pressure with discolored nasal mucus.    Severity: moderate, but progressively worsening Tried OTC meds without significant relief.  Symptoms:  + Fever  + URI prodrome with nasal congestion + Minimal swollen neck glands + mild Sinus Headache + mild ear pressure  No Allergy symptoms No significant Sore Throat No eye symptoms     No significant Cough No chest pain No shortness of breath  No wheezing  No Abdominal Pain No Nausea No Vomiting No diarrhea  No Myalgias No focal neurologic symptoms No syncope No Rash  No Urinary symptoms          Past Medical History  Diagnosis Date  . Meniere disease     takes HCTZ daily  . Thyroid disease     takes Thyroid Armour daily-hypothyroidism  . Tuberculosis     positive skin test  . Depression     takes Wellbutrin daily  . Complication of anesthesia     wakes up crying   . Peripheral edema   . Headache(784.0)     rarely  . Spinal headache   . Weakness     numbness and tingling  . Arthritis     hands   . Impaired hearing     left;has hearing aids but doesn't wear them  . GERD (gastroesophageal reflux disease)     takes OTC med as needed  . History of colon polyps    Past Surgical History  Procedure Laterality Date  . Cesarean section      twice  . Bunionectomy Bilateral   . Tonsillectomy      and adenoids as a child  . Colonoscopy    . Anterior cervical decomp/discectomy fusion N/A 09/03/2013    Procedure: ANTERIOR CERVICAL DECOMPRESSION/DISCECTOMY FUSION 2 LEVELS;  Surgeon: Erline Levine, MD;  Location: [facility];  Service: Neurosurgery;  Laterality: N/A;  C4-5 C5-6  Anterior cervical decompression/diskectomy/fusion   Family History  Problem Relation Age of Onset  . Colon cancer Paternal Grandmother   . Breast cancer Mother   . Prostate cancer Father   . Seizures Father   . Hypertension Father   . Hyperlipidemia Neg Hx   . Heart attack Neg Hx   . Diabetes Neg Hx   . Sudden death Neg Hx    History  Substance Use Topics  . Smoking status: Former Research scientist (life sciences)  . Smokeless tobacco: Never Used     Comment: quit smoking Mar 8,1999  . Alcohol Use: 0.0 oz/week     Comment: occasionally wine   OB History    Gravida Para Term Preterm AB TAB SAB Ectopic Multiple Living   4 3 3  1  1  1 3      Review of Systems  All other systems reviewed and are negative.   Allergies  Review of patient's allergies indicates no known allergies.  Home Medications   Prior to Admission medications   Medication Sig Start Date End Date Taking? Authorizing Provider  buPROPion (WELLBUTRIN XL) 300 MG 24 hr tablet Take 300 mg by mouth daily.     Yes Historical Provider, MD  hydrochlorothiazide 50 MG tablet Take 50 mg by mouth daily.     Yes Historical  Provider, MD  oxyCODONE-acetaminophen (PERCOCET/ROXICET) 5-325 MG per tablet Take 1-2 tablets by mouth every 4 (four) hours as needed for moderate pain or severe pain. 09/04/13  Yes Erline Levine, MD  thyroid (ARMOUR) 90 MG tablet Take 90 mg by mouth daily.     Yes Historical Provider, MD  cefdinir (OMNICEF) 300 MG capsule Take 1 capsule (300 mg total) by mouth 2 (two) times daily. X 10 days 08/26/14   Jacqulyn Cane, MD   BP 133/84 mmHg  Pulse 80  Temp(Src) 98.5 F (36.9 C) (Oral)  Resp 18  Ht 5\' 9"  (1.753 m)  Wt 229 lb (103.874 kg)  BMI 33.80 kg/m2  SpO2 98% Physical Exam  Constitutional: She is oriented to person, place, and time. She appears well-developed and well-nourished. No distress.  HENT:  Head: Normocephalic and atraumatic.  Right Ear: Tympanic membrane, external ear and ear canal normal.  Left Ear: Tympanic  membrane, external ear and ear canal normal.  Nose: Mucosal edema and rhinorrhea present. Right sinus exhibits maxillary sinus tenderness. Left sinus exhibits maxillary sinus tenderness.  Mouth/Throat: Oropharynx is clear and moist. No oral lesions. No oropharyngeal exudate.  Eyes: Right eye exhibits no discharge. Left eye exhibits no discharge. No scleral icterus.  Neck: Neck supple.  Cardiovascular: Normal rate, regular rhythm and normal heart sounds.   Pulmonary/Chest: Effort normal and breath sounds normal. She has no wheezes. She has no rales.  Lymphadenopathy:    She has no cervical adenopathy.  Neurological: She is alert and oriented to person, place, and time.  Skin: Skin is warm and dry.  Nursing note and vitals reviewed.   ED Course  Procedures (including critical care time) Labs Review Labs Reviewed - No data to display  Imaging Review No results found.   MDM   1. Acute maxillary sinusitis, recurrence not specified    Treatment options discussed, as well as risks, benefits, alternatives. Patient voiced understanding and agreement with the following plans: New Prescriptions   CEFDINIR (OMNICEF) 300 MG CAPSULE    Take 1 capsule (300 mg total) by mouth 2 (two) times daily. X 10 days   Other OTC symptomatic care discussed. Follow-up with your primary care doctor in 5-7 days if not improving, or sooner if symptoms become worse. Precautions discussed. Red flags discussed. Questions invited and answered. Patient voiced understanding and agreement.     Jacqulyn Cane, MD 08/26/14 1210

## 2014-12-10 ENCOUNTER — Emergency Department
Admission: EM | Admit: 2014-12-10 | Discharge: 2014-12-10 | Disposition: A | Discharge status: Home / Self Care | Payer: 59 | Source: Home / Self Care | Attending: Family Medicine | Admitting: Family Medicine

## 2014-12-10 ENCOUNTER — Encounter: Payer: Self-pay | Admitting: Emergency Medicine

## 2014-12-10 DIAGNOSIS — K8050 Calculus of bile duct without cholangitis or cholecystitis without obstruction: Secondary | ICD-10-CM

## 2014-12-10 LAB — POCT URINALYSIS DIPSTICK
Bilirubin, UA: NEGATIVE
Blood, UA: NEGATIVE
GLUCOSE UA: NEGATIVE
Ketones, UA: NEGATIVE
Leukocytes, UA: NEGATIVE
NITRITE UA: NEGATIVE
PROTEIN UA: NEGATIVE
Spec Grav, UA: 1.01 (ref 1.005–1.03)
UROBILINOGEN UA: 0.2 (ref 0–1)
pH, UA: 7 (ref 5–8)

## 2014-12-10 LAB — POCT CBC W AUTO DIFF (K'VILLE URGENT CARE)

## 2014-12-10 NOTE — ED Notes (Signed)
Mid to right abdominal pain that radiates around to her back starting about one week ago, has had 6-7 "attacks" in the last week, happens after eating.

## 2014-12-10 NOTE — ED Provider Notes (Signed)
CSN: [id number]     Arrival date & time 12/10/14  1229 History   First MD Initiated Contact with Patient 12/10/14 1341     Chief Complaint  Patient presents with  . Cholelithiasis      HPI Comments: Patient reports that one week ago she had eaten some cashews, and later she awoke with severe mid-abdominal pain that radiated to her back.  She had nausea without vomiting.  Her pain decreased after she took some Percocet that she had on hand.  She has had 6 to 7 recurrent episodes of similar abdominal pain.  She had an episode at 4pm yesterday that resolved spontaneously, and another severe episode at 12:30am today, improved after taking two Percocet tabs.  She has had increased frequency of stools.  No fevers, chills, and sweats. Past surgical history of two C-sections. She had an informal abdominal ultrasound today, and the technicians felt that she had some sludge and stones in the gallbladder but no official reading was done.       Patient is a 59 y.o. female presenting with abdominal pain. The history is provided by the patient.  Abdominal Pain Pain location:  RUQ and epigastric Pain quality: cramping and sharp   Pain radiates to:  Back Pain severity:  Moderate Onset quality:  Sudden Duration:  1 week Timing:  Intermittent Progression:  Worsening Chronicity:  New Context: awakening from sleep, eating and previous surgery   Context: not alcohol use, not diet changes, not recent illness, not recent travel, not sick contacts and not suspicious food intake   Relieved by: Percocet. Worsened by:  Eating Ineffective treatments:  None tried Associated symptoms: diarrhea and nausea   Associated symptoms: no anorexia, no belching, no chest pain, no chills, no constipation, no cough, no dysuria, no fatigue, no fever, no flatus, no hematemesis, no hematochezia, no hematuria, no melena, no shortness of breath, no sore throat and no vomiting   Risk factors: no alcohol abuse     Past Medical  History  Diagnosis Date  . Meniere disease     takes HCTZ daily  . Thyroid disease     takes Thyroid Armour daily-hypothyroidism  . Tuberculosis     positive skin test  . Depression     takes Wellbutrin daily  . Complication of anesthesia     wakes up crying   . Peripheral edema   . Headache(784.0)     rarely  . Spinal headache   . Weakness     numbness and tingling  . Arthritis     hands   . Impaired hearing     left;has hearing aids but doesn't wear them  . GERD (gastroesophageal reflux disease)     takes OTC med as needed  . History of colon polyps    Past Surgical History  Procedure Laterality Date  . Cesarean section      twice  . Bunionectomy Bilateral   . Tonsillectomy      and adenoids as a child  . Colonoscopy    . Anterior cervical decomp/discectomy fusion N/A 09/03/2013    Procedure: ANTERIOR CERVICAL DECOMPRESSION/DISCECTOMY FUSION 2 LEVELS;  Surgeon: Erline Levine, MD;  Location: [place] [facility];  Service: Neurosurgery;  Laterality: N/A;  C4-5 C5-6 Anterior cervical decompression/diskectomy/fusion   Family History  Problem Relation Age of Onset  . Colon cancer Paternal Grandmother   . Breast cancer Mother   . Prostate cancer Father   . Seizures Father   . Hypertension Father   .  Hyperlipidemia Neg Hx   . Heart attack Neg Hx   . Diabetes Neg Hx   . Sudden death Neg Hx    History  Substance Use Topics  . Smoking status: Former Research scientist (life sciences)  . Smokeless tobacco: Never Used     Comment: quit smoking Mar 8,1999  . Alcohol Use: 0.0 oz/week     Comment: occasionally wine   OB History    Gravida Para Term Preterm AB TAB SAB Ectopic Multiple Living   4 3 3  1  1  1 3      Review of Systems  Constitutional: Negative for fever, chills and fatigue.  HENT: Negative for sore throat.   Respiratory: Negative for cough and shortness of breath.   Cardiovascular: Negative for chest pain.  Gastrointestinal: Positive for nausea, abdominal pain and diarrhea. Negative for  vomiting, constipation, melena, hematochezia, anorexia, flatus and hematemesis.  Genitourinary: Negative for dysuria and hematuria.  All other systems reviewed and are negative.   Allergies  Review of patient's allergies indicates no known allergies.  Home Medications   Prior to Admission medications   Medication Sig Start Date End Date Taking? Authorizing Provider  buPROPion (WELLBUTRIN XL) 300 MG 24 hr tablet Take 300 mg by mouth daily.      Historical Provider, MD  cefdinir (OMNICEF) 300 MG capsule Take 1 capsule (300 mg total) by mouth 2 (two) times daily. X 10 days 08/26/14   Jacqulyn Cane, MD  hydrochlorothiazide 50 MG tablet Take 50 mg by mouth daily.      Historical Provider, MD  oxyCODONE-acetaminophen (PERCOCET/ROXICET) 5-325 MG per tablet Take 1-2 tablets by mouth every 4 (four) hours as needed for moderate pain or severe pain. 09/04/13   Erline Levine, MD  thyroid (ARMOUR) 90 MG tablet Take 90 mg by mouth daily.      Historical Provider, MD   BP 119/75 mmHg  Pulse 85  Temp(Src) 97.7 F (36.5 C) (Oral)  Ht 5\' 9"  (1.753 m)  Wt 194 lb (87.998 kg)  BMI 28.64 kg/m2  SpO2 100% Physical Exam Nursing notes and Vital Signs reviewed. Appearance:  Patient appears stated age, and in no acute distress Eyes:  Pupils are equal, round, and reactive to light and accomodation.  Extraocular movement is intact.  Conjunctivae are not inflamed  Pharynx:  Normal; moist mucous membranes  Neck:  Supple. No adenopathy Lungs:  Clear to auscultation.  Breath sounds are equal.  Heart:  Regular rate and rhythm without murmurs, rubs, or gallops.  Abdomen:  Minimal tenderness right upper quadrant without masses or hepatosplenomegaly.  Bowel sounds are present.  No CVA or flank tenderness.  Murphy's sign is not present Extremities:  No edema.  No calf tenderness Skin:  No rash present.   ED Course  Procedures    Labs Reviewed  COMPLETE METABOLIC PANEL WITH GFR  AMYLASE  LIPASE  POCT CBC W AUTO  DIFF ([facility]):  WBC 6.7; LY 28.9; MO 6.2; GR 64.9; Hgb 14.3; Platelets 339   POCT URINALYSIS DIPSTICK:  negative       MDM   1. Biliary colic    CMP, amylase, lipase pending.  Refer to [facility]  Begin clear liquids for about 18 to 24 hours, then may begin a BRAT diet (Bananas, Rice, Applesauce, Toast) when nausea and pain resolved.  Then gradually advance to a regular diet as tolerated.  Take Ibuprofen 200mg , 4 tabs every 8 hours with food.  May take pain medication as needed. If  symptoms become significantly worse during the night or over the weekend, proceed to the local emergency room.    Kandra Nicolas, MD 12/14/14 2110

## 2014-12-10 NOTE — Discharge Instructions (Signed)
Begin clear liquids for about 18 to 24 hours, then may begin a BRAT diet (Bananas, Rice, Applesauce, Toast) when nausea and pain resolved.  Then gradually advance to a regular diet as tolerated.  Take Ibuprofen 200mg , 4 tabs every 8 hours with food.  May take pain medication as needed. If symptoms become significantly worse during the night or over the weekend, proceed to the local emergency room.   Biliary Colic  Biliary colic is a steady or irregular pain in the upper abdomen. It is usually under the right side of the rib cage. It happens when gallstones interfere with the normal flow of bile from the gallbladder. Bile is a liquid that helps to digest fats. Bile is made in the liver and stored in the gallbladder. When you eat a meal, bile passes from the gallbladder through the cystic duct and the common bile duct into the small intestine. There, it mixes with partially digested food. If a gallstone blocks either of these ducts, the normal flow of bile is blocked. The muscle cells in the bile duct contract forcefully to try to move the stone. This causes the pain of biliary colic.  SYMPTOMS   A person with biliary colic usually complains of pain in the upper abdomen. This pain can be:  In the center of the upper abdomen just below the breastbone.  In the upper-right part of the abdomen, near the gallbladder and liver.  Spread back toward the right shoulder blade.  Nausea and vomiting.  The pain usually occurs after eating.  Biliary colic is usually triggered by the digestive system's demand for bile. The demand for bile is high after fatty meals. Symptoms can also occur when a person who has been fasting suddenly eats a very large meal. Most episodes of biliary colic pass after 1 to 5 hours. After the most intense pain passes, your abdomen may continue to ache mildly for about 24 hours. DIAGNOSIS  After you describe your symptoms, your caregiver will perform a physical exam. He or she will  pay attention to the upper right portion of your belly (abdomen). This is the area of your liver and gallbladder. An ultrasound will help your caregiver look for gallstones. Specialized scans of the gallbladder may also be done. Blood tests may be done, especially if you have fever or if your pain persists. PREVENTION  Biliary colic can be prevented by controlling the risk factors for gallstones. Some of these risk factors, such as heredity, increasing age, and pregnancy are a normal part of life. Obesity and a high-fat diet are risk factors you can change through a healthy lifestyle. Women going through menopause who take hormone replacement therapy (estrogen) are also more likely to develop biliary colic. TREATMENT   Pain medication may be prescribed.  You may be encouraged to eat a fat-free diet.  If the first episode of biliary colic is severe, or episodes of colic keep retuning, surgery to remove the gallbladder (cholecystectomy) is usually recommended. This procedure can be done through small incisions using an instrument called a laparoscope. The procedure often requires a brief stay in the hospital. Some people can leave the hospital the same day. It is the most widely used treatment in people troubled by painful gallstones. It is effective and safe, with no complications in more than 90% of cases.  If surgery cannot be done, medication that dissolves gallstones may be used. This medication is expensive and can take months or years to work. Only small stones will  dissolve.  Rarely, medication to dissolve gallstones is combined with a procedure called shock-wave lithotripsy. This procedure uses carefully aimed shock waves to break up gallstones. In many people treated with this procedure, gallstones form again within a few years. PROGNOSIS  If gallstones block your cystic duct or common bile duct, you are at risk for repeated episodes of biliary colic. There is also a 25% chance that you will  develop a gallbladder infection(acute cholecystitis), or some other complication of gallstones within 10 to 20 years. If you have surgery, schedule it at a time that is convenient for you and at a time when you are not sick. HOME CARE INSTRUCTIONS   Drink plenty of clear fluids.  Avoid fatty, greasy or fried foods, or any foods that make your pain worse.  Take medications as directed. SEEK MEDICAL CARE IF:   You develop a fever over 100.5 F (38.1 C).  Your pain gets worse over time.  You develop nausea that prevents you from eating and drinking.  You develop vomiting. SEEK IMMEDIATE MEDICAL CARE IF:   You have continuous or severe belly (abdominal) pain which is not relieved with medications.  You develop nausea and vomiting which is not relieved with medications.  You have symptoms of biliary colic and you suddenly develop a fever and shaking chills. This may signal cholecystitis. Call your caregiver immediately.  You develop a yellow color to your skin or the white part of your eyes (jaundice). Document Released: 12/12/2005 Document Revised: 10/03/2011 Document Reviewed: 02/21/2008 [facility] Patient Information 2015 [place], [place]. This information is not intended to replace advice given to you by your health care provider. Make sure you discuss any questions you have with your health care provider.

## 2014-12-10 NOTE — ED Notes (Signed)
Appt with Dr Barkley Bruns May 19 at [facility] 4:40, be there @ 4:15, fax notes, etc to [facility] [phone], gave info to [facility]

## 2014-12-11 ENCOUNTER — Other Ambulatory Visit: Payer: Self-pay

## 2014-12-11 ENCOUNTER — Encounter: Payer: Self-pay | Admitting: General Surgery

## 2014-12-11 DIAGNOSIS — R1011 Right upper quadrant pain: Secondary | ICD-10-CM

## 2014-12-11 LAB — COMPLETE METABOLIC PANEL WITH GFR
ALT: 43 U/L — AB (ref 0–35)
AST: 34 U/L (ref 0–37)
Albumin: 4.4 g/dL (ref 3.5–5.2)
Alkaline Phosphatase: 79 U/L (ref 39–117)
BILIRUBIN TOTAL: 0.5 mg/dL (ref 0.2–1.2)
BUN: 10 mg/dL (ref 6–23)
CALCIUM: 10.1 mg/dL (ref 8.4–10.5)
CO2: 32 mEq/L (ref 19–32)
Chloride: 95 mEq/L — ABNORMAL LOW (ref 96–112)
Creat: 0.82 mg/dL (ref 0.50–1.10)
GFR, Est African American: 89 mL/min
GFR, Est Non African American: 77 mL/min
Glucose, Bld: 81 mg/dL (ref 70–99)
Potassium: 3.5 mEq/L (ref 3.5–5.3)
SODIUM: 139 meq/L (ref 135–145)
TOTAL PROTEIN: 6.8 g/dL (ref 6.0–8.3)

## 2014-12-11 LAB — LIPASE: LIPASE: 18 U/L (ref 0–75)

## 2014-12-11 LAB — AMYLASE: AMYLASE: 28 U/L (ref 0–105)

## 2014-12-11 NOTE — Addendum Note (Signed)
Addended by: [name] on: 12/11/2014 05:41 PM   Modules accepted: Orders

## 2014-12-11 NOTE — Progress Notes (Signed)
Rose Boyer 12/11/2014 4:58 PM Location: [facility] Patient #: [id number] DOB: 1956/02/25 Married / Language: Rose Boyer / Race: White Female History of Present Illness [name] MD; 12/11/2014 5:43 PM) Patient words: Biliary dyskinesia.  The patient is a 59 year old female    Note:She is referred by Dr. Theone Murdoch because of upper abdominal pain, biliary colic in nature. She has had 7 episodes of this pain in the past week. No fever but some chills. Some nausea and vomiting. Fatty foods tend to make it worse. She had an informal ultrasound done in [place]. The technicians felt that she had some sludge and stones in the gallbladder but no official reading was done. She had a mild elevation of her AST for the rest of her liver functions were normal. Lipase was normal. CBC was ordered but I do not see the results of that. She has been on a clear liquid diet.  Other Problems Rose Boyer, [place]; 12/11/2014 4:58 PM) Arthritis Depression Hemorrhoids Thyroid Disease  Past Surgical History Rose Boyer, [place]; 12/11/2014 4:58 PM) Cesarean Section - Multiple Colon Polyp Removal - Colonoscopy Spinal Surgery - Neck  Diagnostic Studies History Rose Boyer, [name]; 12/11/2014 4:58 PM) Colonoscopy 1-5 years ago Mammogram within last year Pap Smear 1-5 years ago  Allergies Rose Boyer, [place]; 12/11/2014 5:00 PM) No Known Drug Allergies 12/11/2014  Medication History Rose Boyer, [name]; 12/11/2014 5:00 PM) Wellbutrin XL (300MG  Tablet ER 24HR, Oral as directed) Active. Oxycodone-Acetaminophen (5-325MG  Tablet, Oral prn) Active. Armour Thyroid (90MG  Tablet, 1 Oral daily) Active. Medications Reconciled  Social History Rose Boyer, [name]; 12/11/2014 4:58 PM) Alcohol use Occasional alcohol use. Caffeine use Carbonated beverages. No drug use Tobacco use Former smoker.  Family History Rose Boyer, [name]; 12/11/2014 4:58 PM) Arthritis  Father, Mother. Breast Cancer Mother. Depression Father. Hypertension Father. Prostate Cancer Father. Seizure disorder Father. Thyroid problems Sister.  Pregnancy / Birth History Rose Boyer, [name]; 12/11/2014 4:58 PM) Age at menarche 78 years. Age of menopause <43 Gravida 2 Maternal age 21-30 Para 3     Review of Systems ([place]; 12/11/2014 4:58 PM) General Present- Chills. Not Present- Appetite Loss, Fatigue, Fever, Night Sweats, Weight Gain and Weight Loss. HEENT Not Present- Earache, Hearing Loss, Hoarseness, Nose Bleed, Oral Ulcers, Ringing in the Ears, Seasonal Allergies, Sinus Pain, Sore Throat, Visual Disturbances, Wears glasses/contact lenses and Yellow Eyes. Respiratory Not Present- Bloody sputum, Chronic Cough, Difficulty Breathing, Snoring and Wheezing. Breast Not Present- Breast Mass, Breast Pain, Nipple Discharge and Skin Changes. Cardiovascular Not Present- Chest Pain, Difficulty Breathing Lying Down, Leg Cramps, Palpitations, Rapid Heart Rate, Shortness of Breath and Swelling of Extremities. Gastrointestinal Present- Abdominal Pain, Bloating and Nausea. Not Present- Bloody Stool, Change in Bowel Habits, Chronic diarrhea, Constipation, Difficulty Swallowing, Excessive gas, Gets full quickly at meals, Hemorrhoids, Indigestion, Rectal Pain and Vomiting. Female Genitourinary Not Present- Frequency, Nocturia, Painful Urination, Pelvic Pain and Urgency. Musculoskeletal Present- Back Pain. Not Present- Joint Pain, Joint Stiffness, Muscle Pain, Muscle Weakness and Swelling of Extremities. Neurological Not Present- Decreased Memory, Fainting, Headaches, Numbness, Seizures, Tingling, Tremor, Trouble walking and Weakness. Psychiatric Not Present- Anxiety, Bipolar, Change in Sleep Pattern, Depression, Fearful and Frequent crying. Endocrine Not Present- Cold Intolerance, Excessive Hunger, Hair Changes, Heat Intolerance, Hot flashes and New Diabetes. Hematology  Not Present- Easy Bruising, Excessive bleeding, Gland problems, HIV and Persistent Infections.  Vitals Rose Boyer [name]; 12/11/2014 5:01 PM) 12/11/2014 5:01 PM Weight: 196.4 lb Height: 69in Body Surface Area: 2.08 m Body Mass Index:  29 kg/m Temp.: 99.35F(Oral)  Pulse: 64 (Regular)  BP: 128/70 (Sitting, Left Arm, Standard)     Physical Exam [name] MD; 12/11/2014 5:47 PM)  The physical exam findings are as follows: Note:General: Overweight female in NAD. Pleasant and cooperative.  HEENT: [place]/AT, no facial masses  EYES: EOMI, no icterus  CV: RRR, no murmur, no JVD.  CHEST: Breath sounds equal and clear. Respirations nonlabored.  ABDOMEN: Soft, nontender, nondistended, no masses, no organomegaly, active bowel sounds, no hernias.  MUSCULOSKELETAL: good muscle tone, no edema, no venous stasis changes  SKIN: No jaundice.  NEUROLOGIC: Alert and oriented, answers questions appropriately, normal gait and station.  PSYCHIATRIC: Normal mood, affect , and behavior.    Assessment & Plan [name] MD; 12/11/2014 2:92 PM)  BILIARY COLIC (446.28  M38.17) Impression: Fairly typical biliary colic type symptoms that have been frequent the past 7 days. Ultrasound unofficially suggested sludge and gallstones with prominent gallbladder wall. She is not ill-appearing in the office today.  Plan: She can try some selective solid food and we discussed what that would be. Will get a formal abdominal ultrasound. Tentatively for plan on scheduling laparoscopic cholecystectomy with cholangiogram. Also recommend that she take Prilosec OTC daily. If ultrasound does not show gallbladder pathology, will do HIDA scan. I have explained the procedure, risks, and aftercare of cholecystectomy. Risks include but are not limited to bleeding, infection, wound problems, anesthesia, diarrhea, bile leak, injury to common bile duct/liver/intestine. She seems to understand and agrees  with the plan.  Jackolyn Confer, MD

## 2014-12-12 ENCOUNTER — Ambulatory Visit (HOSPITAL_BASED_OUTPATIENT_CLINIC_OR_DEPARTMENT_OTHER)
Admission: RE | Admit: 2014-12-12 | Discharge: 2014-12-12 | Disposition: A | Discharge status: Home / Self Care | Payer: 59 | Source: Ambulatory Visit | Attending: General Surgery | Admitting: General Surgery

## 2014-12-12 DIAGNOSIS — R1013 Epigastric pain: Secondary | ICD-10-CM | POA: Diagnosis not present

## 2014-12-12 DIAGNOSIS — R110 Nausea: Secondary | ICD-10-CM | POA: Insufficient documentation

## 2014-12-15 ENCOUNTER — Telehealth: Payer: Self-pay | Admitting: Emergency Medicine

## 2014-12-17 ENCOUNTER — Encounter (HOSPITAL_COMMUNITY): Payer: Self-pay

## 2014-12-17 ENCOUNTER — Encounter (HOSPITAL_COMMUNITY)
Admission: RE | Admit: 2014-12-17 | Discharge: 2014-12-17 | Disposition: A | Discharge status: Home / Self Care | Payer: 59 | Source: Ambulatory Visit | Attending: General Surgery | Admitting: General Surgery

## 2014-12-17 DIAGNOSIS — K219 Gastro-esophageal reflux disease without esophagitis: Secondary | ICD-10-CM | POA: Diagnosis not present

## 2014-12-17 DIAGNOSIS — K8010 Calculus of gallbladder with chronic cholecystitis without obstruction: Secondary | ICD-10-CM | POA: Diagnosis not present

## 2014-12-17 DIAGNOSIS — Z87891 Personal history of nicotine dependence: Secondary | ICD-10-CM | POA: Diagnosis not present

## 2014-12-17 DIAGNOSIS — M1990 Unspecified osteoarthritis, unspecified site: Secondary | ICD-10-CM | POA: Diagnosis not present

## 2014-12-17 DIAGNOSIS — E039 Hypothyroidism, unspecified: Secondary | ICD-10-CM | POA: Diagnosis not present

## 2014-12-17 DIAGNOSIS — K8020 Calculus of gallbladder without cholecystitis without obstruction: Secondary | ICD-10-CM | POA: Diagnosis present

## 2014-12-17 DIAGNOSIS — F329 Major depressive disorder, single episode, unspecified: Secondary | ICD-10-CM | POA: Diagnosis not present

## 2014-12-17 DIAGNOSIS — F419 Anxiety disorder, unspecified: Secondary | ICD-10-CM | POA: Diagnosis not present

## 2014-12-17 HISTORY — DX: Hypothyroidism, unspecified: E03.9

## 2014-12-17 HISTORY — DX: Anxiety disorder, unspecified: F41.9

## 2014-12-17 LAB — CBC WITH DIFFERENTIAL/PLATELET
Basophils Absolute: 0 10*3/uL (ref 0.0–0.1)
Basophils Relative: 0 % (ref 0–1)
EOS ABS: 0 10*3/uL (ref 0.0–0.7)
Eosinophils Relative: 1 % (ref 0–5)
HCT: 40.1 % (ref 36.0–46.0)
HEMOGLOBIN: 13.5 g/dL (ref 12.0–15.0)
LYMPHS PCT: 29 % (ref 12–46)
Lymphs Abs: 1.5 10*3/uL (ref 0.7–4.0)
MCH: 29.1 pg (ref 26.0–34.0)
MCHC: 33.7 g/dL (ref 30.0–36.0)
MCV: 86.4 fL (ref 78.0–100.0)
MONO ABS: 0.4 10*3/uL (ref 0.1–1.0)
Monocytes Relative: 7 % (ref 3–12)
NEUTROS ABS: 3.2 10*3/uL (ref 1.7–7.7)
Neutrophils Relative %: 63 % (ref 43–77)
PLATELETS: 328 10*3/uL (ref 150–400)
RBC: 4.64 MIL/uL (ref 3.87–5.11)
RDW: 13.4 % (ref 11.5–15.5)
WBC: 5.1 10*3/uL (ref 4.0–10.5)

## 2014-12-17 LAB — PROTIME-INR
INR: 1.05 (ref 0.00–1.49)
PROTHROMBIN TIME: 13.9 s (ref 11.6–15.2)

## 2014-12-17 NOTE — Progress Notes (Signed)
Pt denies SOB, chest pain, and being under the care of a cardiologist. Pt denies having an EKG and chest x ray within the last year. Pt denies having a stress test, echo and cardiac cath. LVM with Colletta [place], surgical scheduler to have MD make corrections to order for consent.

## 2014-12-17 NOTE — Pre-Procedure Instructions (Signed)
Rose Boyer  12/17/2014      [facility] - [place], [place] - [address] [place] [address] Phone: [phone] Fax: [phone]  [place], [place] - [address] [place] [address] Phone: [phone] Fax: [phone]    Your procedure is scheduled on Friday, Dec 19, 2014  Report to [facility] Admitting at 7:30 A.M.  Call this number if you have problems the morning of surgery:   [phone]   Remember:  Do not eat food or drink liquids after midnight Thursday, Dec 18, 2014  Take these medicines the morning of surgery with A SIP OF WATER:  buPROPion (WELLBUTRIN XL), thyroid (ARMOUR), if needed:oxyCODONE-acetaminophen (PERCOCET/ROXICET) for pain  Stop taking Aspirin, vitamins and herbal medications. Do not take any NSAIDs ie: Ibuprofen, Advil, Naproxen or any medication containing Aspirin; stop now.   Do not wear jewelry, make-up or nail polish.  Do not wear lotions, powders, or perfumes.  You may not  wear deodorant.  Do not shave 48 hours prior to surgery.    Do not bring valuables to the hospital.  [facility] is not responsible for any belongings or valuables.  Contacts, dentures or bridgework may not be worn into surgery.  Leave your suitcase in the car.  After surgery it may be brought to your room.  For patients admitted to the hospital, discharge time will be determined by your treatment team.  Patients discharged the day of surgery will not be allowed to drive home.   Name and phone number of your driver:    Special instructions:   Special Instructions:Special Instructions: [facility] - Preparing for Surgery  Before surgery, you can play an important role.  Because skin is not sterile, your skin needs to be as free of germs as possible.  You can reduce the number of germs on you skin by washing with CHG  (chlorahexidine gluconate) soap before surgery.  CHG is an antiseptic cleaner which kills germs and bonds with the skin to continue killing germs even after washing.  Please DO NOT use if you have an allergy to CHG or antibacterial soaps.  If your skin becomes reddened/irritated stop using the CHG and inform your nurse when you arrive at Short Stay.  Do not shave (including legs and underarms) for at least 48 hours prior to the first CHG shower.  You may shave your face.  Please follow these instructions carefully:   1.  Shower with CHG Soap the night before surgery and the morning of Surgery.  2.  If you choose to wash your hair, wash your hair first as usual with your normal shampoo.  3.  After you shampoo, rinse your hair and body thoroughly to remove the Shampoo.  4.  Use CHG as you would any other liquid soap.  You can apply chg directly  to the skin and wash gently with scrungie or a clean washcloth.  5.  Apply the CHG Soap to your body ONLY FROM THE NECK DOWN.  Do not use on open wounds or open sores.  Avoid contact with your eyes, ears, mouth and genitals (private parts).  Wash genitals (private parts) with your normal soap.  6.  Wash thoroughly, paying special attention to the area where your surgery will be performed.  7.  Thoroughly rinse your body with warm water from the neck down.  8.  DO NOT shower/wash with your normal soap after using and rinsing off the CHG Soap.  9.  Pat yourself dry with a clean towel.            10.  Wear clean pajamas.            11.  Place clean sheets on your bed the night of your first shower and do not sleep with pets.  Day of Surgery  Do not apply any lotions/deodorants the morning of surgery.  Please wear clean clothes to the hospital/surgery center.  Please read over the following fact sheets that you were given. Pain Booklet, Coughing and Deep Breathing and Surgical Site Infection Prevention

## 2014-12-18 MED ORDER — CEFAZOLIN SODIUM-DEXTROSE 2-3 GM-% IV SOLR
2.0000 g | INTRAVENOUS | Status: AC
  Administered 2014-12-19: 2 g via INTRAVENOUS
  Filled 2014-12-18: qty 50

## 2014-12-19 ENCOUNTER — Ambulatory Visit (HOSPITAL_COMMUNITY): Payer: 59 | Admitting: Anesthesiology

## 2014-12-19 ENCOUNTER — Ambulatory Visit (HOSPITAL_COMMUNITY): Payer: 59

## 2014-12-19 ENCOUNTER — Encounter (HOSPITAL_COMMUNITY)
Admission: RE | Disposition: A | Discharge status: Home / Self Care | Payer: Self-pay | Source: Ambulatory Visit | Attending: General Surgery

## 2014-12-19 ENCOUNTER — Encounter (HOSPITAL_COMMUNITY): Payer: Self-pay | Admitting: *Deleted

## 2014-12-19 ENCOUNTER — Ambulatory Visit (HOSPITAL_COMMUNITY)
Admission: RE | Admit: 2014-12-19 | Discharge: 2014-12-19 | Disposition: A | Discharge status: Home / Self Care | Payer: 59 | Source: Ambulatory Visit | Attending: General Surgery | Admitting: General Surgery

## 2014-12-19 DIAGNOSIS — K8010 Calculus of gallbladder with chronic cholecystitis without obstruction: Secondary | ICD-10-CM | POA: Insufficient documentation

## 2014-12-19 DIAGNOSIS — Z87891 Personal history of nicotine dependence: Secondary | ICD-10-CM | POA: Insufficient documentation

## 2014-12-19 DIAGNOSIS — M1990 Unspecified osteoarthritis, unspecified site: Secondary | ICD-10-CM | POA: Insufficient documentation

## 2014-12-19 DIAGNOSIS — K829 Disease of gallbladder, unspecified: Secondary | ICD-10-CM

## 2014-12-19 DIAGNOSIS — E039 Hypothyroidism, unspecified: Secondary | ICD-10-CM | POA: Insufficient documentation

## 2014-12-19 DIAGNOSIS — K219 Gastro-esophageal reflux disease without esophagitis: Secondary | ICD-10-CM | POA: Insufficient documentation

## 2014-12-19 DIAGNOSIS — F419 Anxiety disorder, unspecified: Secondary | ICD-10-CM | POA: Insufficient documentation

## 2014-12-19 DIAGNOSIS — F329 Major depressive disorder, single episode, unspecified: Secondary | ICD-10-CM | POA: Insufficient documentation

## 2014-12-19 HISTORY — PX: CHOLECYSTECTOMY: SHX55

## 2014-12-19 SURGERY — LAPAROSCOPIC CHOLECYSTECTOMY WITH INTRAOPERATIVE CHOLANGIOGRAM
Anesthesia: General | Site: Abdomen

## 2014-12-19 MED ORDER — PROPOFOL 10 MG/ML IV BOLUS
INTRAVENOUS | Status: AC
  Filled 2014-12-19: qty 20

## 2014-12-19 MED ORDER — BUPIVACAINE-EPINEPHRINE 0.25% -1:200000 IJ SOLN
INTRAMUSCULAR | Status: DC | PRN
  Administered 2014-12-19: 15 mL

## 2014-12-19 MED ORDER — MIDAZOLAM HCL 5 MG/5ML IJ SOLN
INTRAMUSCULAR | Status: DC | PRN
  Administered 2014-12-19: 2 mg via INTRAVENOUS

## 2014-12-19 MED ORDER — DEXTROSE IN LACTATED RINGERS 5 % IV SOLN: INTRAVENOUS | Status: DC

## 2014-12-19 MED ORDER — ROCURONIUM BROMIDE 100 MG/10ML IV SOLN
INTRAVENOUS | Status: DC | PRN
  Administered 2014-12-19: 50 mg via INTRAVENOUS

## 2014-12-19 MED ORDER — LIDOCAINE HCL (CARDIAC) 20 MG/ML IV SOLN
INTRAVENOUS | Status: DC | PRN
  Administered 2014-12-19: 60 mg via INTRAVENOUS

## 2014-12-19 MED ORDER — BUPIVACAINE-EPINEPHRINE (PF) 0.25% -1:200000 IJ SOLN
INTRAMUSCULAR | Status: AC
  Filled 2014-12-19: qty 30

## 2014-12-19 MED ORDER — ACETAMINOPHEN 325 MG PO TABS: 650.0000 mg | ORAL_TABLET | ORAL | Status: DC | PRN

## 2014-12-19 MED ORDER — ONDANSETRON HCL 4 MG/2ML IJ SOLN
INTRAMUSCULAR | Status: AC
  Filled 2014-12-19: qty 2

## 2014-12-19 MED ORDER — NEOSTIGMINE METHYLSULFATE 10 MG/10ML IV SOLN
INTRAVENOUS | Status: DC | PRN
  Administered 2014-12-19: 5 mg via INTRAVENOUS

## 2014-12-19 MED ORDER — MIDAZOLAM HCL 2 MG/2ML IJ SOLN
INTRAMUSCULAR | Status: AC
  Filled 2014-12-19: qty 2

## 2014-12-19 MED ORDER — OXYCODONE HCL 5 MG PO TABS
5.0000 mg | ORAL_TABLET | ORAL | Status: DC | PRN
  Administered 2014-12-19: 10 mg via ORAL

## 2014-12-19 MED ORDER — PHENYLEPHRINE HCL 10 MG/ML IJ SOLN
INTRAMUSCULAR | Status: DC | PRN
  Administered 2014-12-19: 40 ug via INTRAVENOUS

## 2014-12-19 MED ORDER — MORPHINE SULFATE 2 MG/ML IJ SOLN
INTRAMUSCULAR | Status: AC
  Filled 2014-12-19: qty 1

## 2014-12-19 MED ORDER — OXYCODONE HCL 5 MG PO TABS
ORAL_TABLET | ORAL | Status: AC
  Filled 2014-12-19: qty 2

## 2014-12-19 MED ORDER — DEXAMETHASONE SODIUM PHOSPHATE 4 MG/ML IJ SOLN
INTRAMUSCULAR | Status: DC | PRN
  Administered 2014-12-19: 8 mg via INTRAVENOUS

## 2014-12-19 MED ORDER — FENTANYL CITRATE (PF) 100 MCG/2ML IJ SOLN
INTRAMUSCULAR | Status: DC | PRN
  Administered 2014-12-19: 100 ug via INTRAVENOUS
  Administered 2014-12-19: 50 ug via INTRAVENOUS
  Administered 2014-12-19: 100 ug via INTRAVENOUS
  Administered 2014-12-19: 50 ug via INTRAVENOUS

## 2014-12-19 MED ORDER — ONDANSETRON HCL 4 MG PO TABS: 4.0000 mg | ORAL_TABLET | Freq: Three times a day (TID) | ORAL | Status: DC | PRN

## 2014-12-19 MED ORDER — MORPHINE SULFATE 2 MG/ML IJ SOLN
2.0000 mg | INTRAMUSCULAR | Status: DC | PRN
  Administered 2014-12-19: 2 mg via INTRAVENOUS

## 2014-12-19 MED ORDER — 0.9 % SODIUM CHLORIDE (POUR BTL) OPTIME
TOPICAL | Status: DC | PRN
  Administered 2014-12-19: 1000 mL

## 2014-12-19 MED ORDER — ACETAMINOPHEN 650 MG RE SUPP: 650.0000 mg | RECTAL | Status: DC | PRN

## 2014-12-19 MED ORDER — ONDANSETRON HCL 4 MG/2ML IJ SOLN
4.0000 mg | INTRAMUSCULAR | Status: DC | PRN
  Administered 2014-12-19: 4 mg via INTRAVENOUS

## 2014-12-19 MED ORDER — SODIUM CHLORIDE 0.9 % IJ SOLN: 3.0000 mL | INTRAMUSCULAR | Status: DC | PRN

## 2014-12-19 MED ORDER — LACTATED RINGERS IV SOLN
INTRAVENOUS | Status: DC | PRN
  Administered 2014-12-19 (×2): via INTRAVENOUS

## 2014-12-19 MED ORDER — PROPOFOL 10 MG/ML IV BOLUS
INTRAVENOUS | Status: DC | PRN
  Administered 2014-12-19: 160 mg via INTRAVENOUS

## 2014-12-19 MED ORDER — OXYCODONE HCL 5 MG PO TABS: 5.0000 mg | ORAL_TABLET | ORAL | Status: DC | PRN

## 2014-12-19 MED ORDER — SODIUM CHLORIDE 0.9 % IR SOLN
Status: DC | PRN
  Administered 2014-12-19: 1000 mL

## 2014-12-19 MED ORDER — ONDANSETRON HCL 4 MG/2ML IJ SOLN
INTRAMUSCULAR | Status: DC | PRN
  Administered 2014-12-19: 4 mg via INTRAVENOUS

## 2014-12-19 MED ORDER — FENTANYL CITRATE (PF) 250 MCG/5ML IJ SOLN
INTRAMUSCULAR | Status: AC
  Filled 2014-12-19: qty 5

## 2014-12-19 MED ORDER — SODIUM CHLORIDE 0.9 % IV SOLN
INTRAVENOUS | Status: DC | PRN
  Administered 2014-12-19: 5 mL

## 2014-12-19 MED ORDER — GLYCOPYRROLATE 0.2 MG/ML IJ SOLN
INTRAMUSCULAR | Status: DC | PRN
  Administered 2014-12-19: 0.6 mg via INTRAVENOUS

## 2014-12-19 MED ORDER — MORPHINE SULFATE 4 MG/ML IJ SOLN
INTRAMUSCULAR | Status: AC
Start: 2014-12-19 — End: 2014-12-19
  Administered 2014-12-19: 4 mg
  Filled 2014-12-19: qty 1

## 2014-12-19 SURGICAL SUPPLY — 47 items
APL SKNCLS STERI-STRIP NONHPOA (GAUZE/BANDAGES/DRESSINGS)
APPLIER CLIP 5 13 M/L LIGAMAX5 (MISCELLANEOUS) ×2
APR CLP MED LRG 5 ANG JAW (MISCELLANEOUS) ×1
BAG SPEC RTRVL LRG 6X4 10 (ENDOMECHANICALS) ×1
BENZOIN TINCTURE PRP APPL 2/3 (GAUZE/BANDAGES/DRESSINGS) IMPLANT
CANISTER SUCTION 2500CC (MISCELLANEOUS) ×2 IMPLANT
CATH REDDICK CHOLANGI 4FR 50CM (CATHETERS) ×2 IMPLANT
CHLORAPREP W/TINT 26ML (MISCELLANEOUS) ×2 IMPLANT
CLIP APPLIE 5 13 M/L LIGAMAX5 (MISCELLANEOUS) ×1 IMPLANT
CONT SPEC 4OZ CLIKSEAL STRL BL (MISCELLANEOUS) ×2 IMPLANT
COVER MAYO STAND STRL (DRAPES) ×2 IMPLANT
COVER SURGICAL LIGHT HANDLE (MISCELLANEOUS) ×2 IMPLANT
DECANTER SPIKE VIAL GLASS SM (MISCELLANEOUS) ×3 IMPLANT
DRAPE C-ARM 42X72 X-RAY (DRAPES) ×2 IMPLANT
DRAPE LAPAROSCOPIC ABDOMINAL (DRAPES) ×2 IMPLANT
DRSG TEGADERM 2-3/8X2-3/4 SM (GAUZE/BANDAGES/DRESSINGS) ×8 IMPLANT
ELECT REM PT RETURN 9FT ADLT (ELECTROSURGICAL) ×2
ELECTRODE REM PT RTRN 9FT ADLT (ELECTROSURGICAL) ×1 IMPLANT
GAUZE SPONGE 2X2 8PLY STRL LF (GAUZE/BANDAGES/DRESSINGS) ×1 IMPLANT
GLOVE BIOGEL PI IND STRL 7.5 (GLOVE) IMPLANT
GLOVE BIOGEL PI IND STRL 8 (GLOVE) ×2 IMPLANT
GLOVE BIOGEL PI INDICATOR 7.5 (GLOVE) ×1
GLOVE BIOGEL PI INDICATOR 8 (GLOVE) ×2
GLOVE ECLIPSE 7.5 STRL STRAW (GLOVE) ×1 IMPLANT
GLOVE ECLIPSE 8.0 STRL XLNG CF (GLOVE) ×2 IMPLANT
GLOVE SURG SS PI 8.0 STRL IVOR (GLOVE) ×2 IMPLANT
GOWN STRL REUS W/ TWL LRG LVL3 (GOWN DISPOSABLE) ×3 IMPLANT
GOWN STRL REUS W/TWL LRG LVL3 (GOWN DISPOSABLE) ×10
IV CATH 14GX2 1/4 (CATHETERS) ×2 IMPLANT
KIT BASIN OR (CUSTOM PROCEDURE TRAY) ×2 IMPLANT
KIT ROOM TURNOVER OR (KITS) ×2 IMPLANT
NS IRRIG 1000ML POUR BTL (IV SOLUTION) ×2 IMPLANT
PAD ARMBOARD 7.5X6 YLW CONV (MISCELLANEOUS) ×2 IMPLANT
POUCH SPECIMEN RETRIEVAL 10MM (ENDOMECHANICALS) ×2 IMPLANT
SCISSORS LAP 5X35 DISP (ENDOMECHANICALS) ×2 IMPLANT
SET IRRIG TUBING LAPAROSCOPIC (IRRIGATION / IRRIGATOR) ×2 IMPLANT
SLEEVE ENDOPATH XCEL 5M (ENDOMECHANICALS) ×4 IMPLANT
SPECIMEN JAR SMALL (MISCELLANEOUS) ×1 IMPLANT
SPONGE GAUZE 2X2 STER 10/PKG (GAUZE/BANDAGES/DRESSINGS) ×1
STRIP CLOSURE SKIN 1/2X4 (GAUZE/BANDAGES/DRESSINGS) ×1 IMPLANT
SUT MON AB 4-0 PC3 18 (SUTURE) ×3 IMPLANT
TOWEL OR 17X26 10 PK STRL BLUE (TOWEL DISPOSABLE) ×2 IMPLANT
TRAY LAPAROSCOPIC (CUSTOM PROCEDURE TRAY) ×2 IMPLANT
TROCAR XCEL BLUNT TIP 100MML (ENDOMECHANICALS) ×2 IMPLANT
TROCAR XCEL NON-BLD 11X100MML (ENDOMECHANICALS) IMPLANT
TROCAR XCEL NON-BLD 5MMX100MML (ENDOMECHANICALS) ×2 IMPLANT
TUBING INSUFFLATION (TUBING) ×2 IMPLANT

## 2014-12-19 NOTE — Anesthesia Postprocedure Evaluation (Signed)
  Anesthesia Post-op Note  Patient: Rose Boyer  Procedure(s) Performed: Procedure(s): LAPAROSCOPIC CHOLECYSTECTOMY WITH INTRAOPERATIVE CHOLANGIOGRAM (N/A)  Patient Location: PACU  Anesthesia Type:General  Level of Consciousness: awake and alert   Airway and Oxygen Therapy: Patient Spontanous Breathing  Post-op Pain: mild  Post-op Assessment: Post-op Vital signs reviewed  Post-op Vital Signs: stable  Last Vitals:  Filed Vitals:   12/19/14 1245  BP: 116/66  Pulse: 63  Temp:   Resp: 8    Complications: No apparent anesthesia complications

## 2014-12-19 NOTE — Anesthesia Preprocedure Evaluation (Signed)
Anesthesia Evaluation  Patient identified by MRN, date of birth, ID band Patient awake    History of Anesthesia Complications Negative for: history of anesthetic complications  Airway Mallampati: I  TM Distance: >3 FB Neck ROM: Limited    Dental   Pulmonary former smoker,  breath sounds clear to auscultation        Cardiovascular Rhythm:Regular Rate:Normal     Neuro/Psych  Headaches, Anxiety Depression    GI/Hepatic GERD-  ,  Endo/Other  Hypothyroidism   Renal/GU      Musculoskeletal  (+) Arthritis -,   Abdominal   Peds  Hematology   Anesthesia Other Findings   Reproductive/Obstetrics                             Anesthesia Physical Anesthesia Plan  ASA: II  Anesthesia Plan: General   Post-op Pain Management:    Induction: Intravenous  Airway Management Planned: Oral ETT  Additional Equipment:   Intra-op Plan:   Post-operative Plan: Extubation in OR  Informed Consent: I have reviewed the patients History and Physical, chart, labs and discussed the procedure including the risks, benefits and alternatives for the proposed anesthesia with the patient or authorized representative who has indicated his/her understanding and acceptance.   Dental advisory given  Plan Discussed with: CRNA and Surgeon  Anesthesia Plan Comments:         Anesthesia Quick Evaluation

## 2014-12-19 NOTE — Anesthesia Procedure Notes (Signed)
Procedure Name: Intubation Date/Time: 12/19/2014 10:27 AM Performed by: Vennie Homans Pre-anesthesia Checklist: Patient identified, Timeout performed, Emergency Drugs available, Suction available and Patient being monitored Patient Re-evaluated:Patient Re-evaluated prior to inductionOxygen Delivery Method: Circle system utilized Preoxygenation: Pre-oxygenation with 100% oxygen Intubation Type: IV induction and Combination inhalational/ intravenous induction Ventilation: Mask ventilation without difficulty Laryngoscope Size: 3 and Mac Grade View: Grade I Tube size: 7.0 mm Number of attempts: 1 Airway Equipment and Method: Stylet Placement Confirmation: ETT inserted through vocal cords under direct vision,  breath sounds checked- equal and bilateral and positive ETCO2 Secured at: 21 cm Tube secured with: Tape Dental Injury: Teeth and Oropharynx as per pre-operative assessment

## 2014-12-19 NOTE — Transfer of Care (Signed)
Immediate Anesthesia Transfer of Care Note  Patient: Rose Boyer  Procedure(s) Performed: Procedure(s): LAPAROSCOPIC CHOLECYSTECTOMY WITH INTRAOPERATIVE CHOLANGIOGRAM (N/A)  Patient Location: PACU  Anesthesia Type:General  Level of Consciousness: awake, alert , oriented, patient cooperative and responds to stimulation  Airway & Oxygen Therapy: Patient Spontanous Breathing and Patient connected to nasal cannula oxygen  Post-op Assessment: Report given to RN, Post -op Vital signs reviewed and stable, Patient moving all extremities X 4 and Patient able to stick tongue midline  Post vital signs: stable  Last Vitals:  Filed Vitals:   12/19/14 1145  BP: 141/73  Pulse: 73  Temp: 36.8 C  Resp: 11    Complications: No apparent anesthesia complications

## 2014-12-19 NOTE — Op Note (Signed)
OPERATIVE NOTE-   Preoperative diagnosis:  SYMPTOMATIC CHOLELITHIASIS  Postoperative diagnosis:  same  Procedure: Laparoscopic cholecystectomy with cholangiogram.  Surgeon: Jackolyn Confer, M.D.  Asst.:  Judyann Munson RNFA  Anesthesia: General  Indication:   This is a 59 year female with typical was prandial biliary colic type pain. Ultrasound demonstrated a significant amount of debris in the gallbladder consistent with sludge in possibly small stones. Liver function tests were normal. She presents now for elective cholecystectomy.  Technique: She was brought to the operating room, placed supine on the operating table, and a general anesthetic was administered. The abdominal wall was then sterilely prepped and draped. Local anesthetic (Marcaine) was infiltrated in the subumbilical region. A small subumbilical incision was made through the skin, subcutaneous tissue, fascia, and peritoneum entering the peritoneal cavity under direct vision. A pursestring suture of 0 Vicryl was placed around the edges of the fascia. A Hassan trocar was introduced into the peritoneal cavity and a pneumoperitoneum was created by insufflation of carbon dioxide gas. The laparoscope was introduced into the trocar and no underlying bleeding or organ injury was noted. He/She was then placed in the reverse Trendelenburg position with the right side tilted slightly up.  Three 5 mm trocars were then placed into the abdominal cavity under laparoscopic vision. One in the epigastric area, and 2 in the right upper quadrant area. The gallbladder was visualized and the fundus was grasped and retracted toward the right shoulder.  The gallbladder was yellow-white in color consistent with chronic inflammatory changes. There were adhesions between the colon and the gallbladder which were separated sharply and bluntly. There was no injury to the colon.  The infundibulum was mobilized with dissection close to the gallbladder and  retracted laterally. The cystic duct was identified and a window was created around it. The anterior branch of the cystic artery was also identified and a window was created around it.  It was clipped and divided. The critical view was achieved. A clip was placed at the neck of the gallbladder. A small incision was made in the cystic duct.  Multiple small gallstones were milked out of the cystic duct and evacuated from the abdominal cavity.  A cholangiocatheter was introduced through the anterior abdominal wall and placed in the cystic duct. A intraoperative cholangiogram was then performed.  Under real-time fluoroscopy, dilute contrast was injected into the cystic duct.  The common hepatic duct, the right and left hepatic ducts, and the common duct were all visualized. Contrast drained into the duodenum without obvious evidence of any obstructing ductal lesion. The final report is pending the Radiologist's interpretation.  The cholangiocatheter was removed, the cystic duct was clipped 3 times on the biliary side, and then the cystic duct was divided sharply. No bile leak was noted from the cystic duct stump.  The posterior branch of the cystic artery, identified, isolated, and was then clipped and divided. Following this the gallbladder was dissected free from the liver using electrocautery.  A small puncture wound is made in the gallbladder and bile leaked out but no stones spilled. The gallbladder was then placed in a retrieval bag and removed from the abdominal cavity through the subumbilical incision.  The gallbladder fossa was inspected, copiously irrigated, and bleeding was controlled with electrocautery. Inspection showed that hemostasis was adequate and there was no evidence of bile leak.  The irrigation fluid was evacuated as much as possible.  The subumbilical trocar was removed and the fascial defect was closed by tightening and tying  down the pursestring suture under laparoscopic  vision.  The remaining trocars were removed and the pneumoperitoneum was released. The skin incisions were closed with 4-0 Monocryl subcuticular stitches. Steri-Strips and sterile dressings were applied.  The procedure was well-tolerated without any apparent complications. She was taken to the recovery room in satisfactory condition.

## 2014-12-19 NOTE — Discharge Instructions (Signed)
[facility] ______[facility] LAPAROSCOPIC SURGERY: POST OP INSTRUCTIONS Always review your discharge instruction sheet given to you by the facility where your surgery was performed. IF YOU HAVE DISABILITY OR FAMILY LEAVE FORMS, YOU MUST BRING THEM TO THE OFFICE FOR PROCESSING.   DO NOT GIVE THEM TO YOUR DOCTOR.  1. A prescription for pain medication may be given to you upon discharge.  Take your pain medication as prescribed, if needed.  If narcotic pain medicine is not needed, then you may take acetaminophen (Tylenol) or ibuprofen (Advil) as needed. 2. Take your usually prescribed medications unless otherwise directed. 3. If you need a refill on your pain medication, please contact your pharmacy.  They will contact our office to request authorization. Prescriptions will not be filled after 5pm or on week-ends. 4. You should follow a light diet the first few days after arrival home, such as soup and crackers, etc.  Be sure to include lots of fluids daily. 5. Most patients will experience some swelling and bruising in the area of the incisions.  Ice packs will help.  Swelling and bruising can take several days to resolve.  6. It is common to experience some constipation if taking pain medication after surgery.  Increasing fluid intake and taking a stool softener (such as Colace) will usually help or prevent this problem from occurring.  A mild laxative (Milk of Magnesia or Miralax) should be taken according to package instructions if there are no bowel movements after 48 hours. 7. Unless discharge instructions indicate otherwise, you may remove your bandages 72 hours after surgery; you may shower the day after your surgery..  You may have steri-strips (small skin tapes) in place directly over the incision.  These strips should be left on the skin .  If your surgeon used skin glue on the incision, you may shower in 24 hours.  The glue will flake off over the next 2-3 weeks.  Any sutures or  staples will be removed at the office during your follow-up visit. 8. ACTIVITIES:  You may resume regular (light) daily activities beginning the next day--such as daily self-care, walking, climbing stairs--gradually increasing activities as tolerated.  You may have sexual intercourse when it is comfortable.  Refrain from any heavy lifting or straining-nothing over 10 pounds for 2 weeks.  a. You may drive when you are no longer taking prescription pain medication, you can comfortably wear a seatbelt, and you can safely maneuver your car and apply brakes. b. 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 You should see your doctor in the office for a follow-up appointment approximately 2-3 weeks after your surgery.  Make sure that you call for this appointment within a day or two after you arrive home to insure a convenient appointment time. 10. OTHER INSTRUCTIONS: __________________________________________________________________________________________________________________________ __________________________________________________________________________________________________________________________ WHEN TO CALL YOUR DOCTOR: 1. Fever over 101.0 2. Inability to urinate 3. Continued bleeding from incision. 4. Increased pain, redness, or drainage from the incision. 5. Increasing abdominal pain  The clinic staff is available to answer your questions during regular business hours.  Please dont hesitate to call and ask to speak to one of the nurses for clinical concerns.  If you have a medical emergency, go to the nearest emergency room or call 911.  A surgeon from [facility] is always on call at the hospital. [address], [place], [place], [place]  [address] ? [address] [address], [place], [place]  [address] [phone] ? [phone] ? FAX [phone] Web site:  [email or web address]

## 2014-12-19 NOTE — Interval H&P Note (Signed)
History and Physical Interval Note:  12/19/2014 9:01 AM  Rose Boyer  has presented today for surgery, with the diagnosis of SYMPTOMATIC CHOLELITHIASIS  The various methods of treatment have been discussed with the patient and family. After consideration of risks, benefits and other options for treatment, the patient has consented to  Procedure(s): LAPAROSCOPIC CHOLECYSTECTOMY WITH INTRAOPERATIVE CHOLANGIOGRAM (N/A) as a surgical intervention .  The patient's history has been reviewed, patient examined, no change in status, stable for surgery.  I have reviewed the patient's chart and labs.  Questions were answered to the patient's satisfaction.     [name]

## 2014-12-19 NOTE — H&P (View-Only) (Signed)
Rose Boyer 12/11/2014 4:58 PM Location: [name] Patient #: [id number] DOB: 1955/09/12 Married / Language: Rose Boyer / Race: White Female History of Present Illness Rose Hollingshead MD; 12/11/2014 5:43 PM) Patient words: Biliary dyskinesia.  The patient is a 59 year old female    Note:She is referred by Dr. Theone Boyer because of upper abdominal pain, biliary colic in nature. She has had 7 episodes of this pain in the past week. No fever but some chills. Some nausea and vomiting. Fatty foods tend to make it worse. She had an informal ultrasound done in [place]. The technicians felt that she had some sludge and stones in the gallbladder but no official reading was done. She had a mild elevation of her AST for the rest of her liver functions were normal. Lipase was normal. CBC was ordered but I do not see the results of that. She has been on a clear liquid diet.  Other Problems Rose Boyer, [name]; 12/11/2014 4:58 PM) Arthritis Depression Hemorrhoids Thyroid Disease  Past Surgical History Rose Boyer, [place]; 12/11/2014 4:58 PM) Cesarean Section - Multiple Colon Polyp Removal - Colonoscopy Spinal Surgery - Neck  Diagnostic Studies History Rose Boyer, [name]; 12/11/2014 4:58 PM) Colonoscopy 1-5 years ago Mammogram within last year Pap Smear 1-5 years ago  Allergies Rose Boyer, [name]; 12/11/2014 5:00 PM) No Known Drug Allergies 12/11/2014  Medication History Rose Boyer, [name]; 12/11/2014 5:00 PM) Wellbutrin XL (300MG  Tablet ER 24HR, Oral as directed) Active. Oxycodone-Acetaminophen (5-325MG  Tablet, Oral prn) Active. Armour Thyroid (90MG  Tablet, 1 Oral daily) Active. Medications Reconciled  Social History Rose Boyer, [name]; 12/11/2014 4:58 PM) Alcohol use Occasional alcohol use. Caffeine use Carbonated beverages. No drug use Tobacco use Former smoker.  Family History Rose Boyer, [name]; 12/11/2014 4:58 PM) Arthritis  Rose Boyer, Rose Boyer. Breast Cancer Rose Boyer. Depression Rose Boyer. Hypertension Rose Boyer. Prostate Cancer Rose Boyer. Seizure disorder Rose Boyer. Thyroid problems Rose Boyer.  Pregnancy / Birth History Rose Boyer, [name]; 12/11/2014 4:58 PM) Age at menarche 44 years. Age of menopause <43 Gravida 2 Maternal age 25-30 Para 3     Review of Systems ([name]; 12/11/2014 4:58 PM) General Present- Chills. Not Present- Appetite Loss, Fatigue, Fever, Night Sweats, Weight Gain and Weight Loss. HEENT Not Present- Earache, Hearing Loss, Hoarseness, Nose Bleed, Oral Ulcers, Ringing in the Ears, Seasonal Allergies, Sinus Pain, Sore Throat, Visual Disturbances, Wears glasses/contact lenses and Yellow Eyes. Respiratory Not Present- Bloody sputum, Chronic Cough, Difficulty Breathing, Snoring and Wheezing. Breast Not Present- Breast Mass, Breast Pain, Nipple Discharge and Skin Changes. Cardiovascular Not Present- Chest Pain, Difficulty Breathing Lying Down, Leg Cramps, Palpitations, Rapid Heart Rate, Shortness of Breath and Swelling of Extremities. Gastrointestinal Present- Abdominal Pain, Bloating and Nausea. Not Present- Bloody Stool, Change in Bowel Habits, Chronic diarrhea, Constipation, Difficulty Swallowing, Excessive gas, Gets full quickly at meals, Hemorrhoids, Indigestion, Rectal Pain and Vomiting. Female Genitourinary Not Present- Frequency, Nocturia, Painful Urination, Pelvic Pain and Urgency. Musculoskeletal Present- Back Pain. Not Present- Joint Pain, Joint Stiffness, Muscle Pain, Muscle Weakness and Swelling of Extremities. Neurological Not Present- Decreased Memory, Fainting, Headaches, Numbness, Seizures, Tingling, Tremor, Trouble walking and Weakness. Psychiatric Not Present- Anxiety, Bipolar, Change in Sleep Pattern, Depression, Fearful and Frequent crying. Endocrine Not Present- Cold Intolerance, Excessive Hunger, Hair Changes, Heat Intolerance, Hot flashes and New Diabetes. Hematology  Not Present- Easy Bruising, Excessive bleeding, Gland problems, HIV and Persistent Infections.  Vitals Rose Boyer [name]; 12/11/2014 5:01 PM) 12/11/2014 5:01 PM Weight: 196.4 lb Height: 69in Body Surface Area: 2.08 m Body Mass Index:  29 kg/m Temp.: 99.52F(Oral)  Pulse: 64 (Regular)  BP: 128/70 (Sitting, Left Arm, Standard)     Physical Exam Rose Hollingshead MD; 12/11/2014 5:47 PM)  The physical exam findings are as follows: Note:General: Overweight female in NAD. Pleasant and cooperative.  HEENT: Rose Boyer/AT, no facial masses  EYES: EOMI, no icterus  CV: RRR, no murmur, no JVD.  CHEST: Breath sounds equal and clear. Respirations nonlabored.  ABDOMEN: Soft, nontender, nondistended, no masses, no organomegaly, active bowel sounds, no hernias.  MUSCULOSKELETAL: good muscle tone, no edema, no venous stasis changes  SKIN: No jaundice.  NEUROLOGIC: Alert and oriented, answers questions appropriately, normal gait and station.  PSYCHIATRIC: Normal mood, affect , and behavior.    Assessment & Plan Rose Hollingshead MD; 12/11/2014 1:22 PM)  BILIARY COLIC (482.50  I37.04) Impression: Fairly typical biliary colic type symptoms that have been frequent the past 7 days. Ultrasound unofficially suggested sludge and gallstones with prominent gallbladder wall. She is not ill-appearing in the office today.  Plan: She can try some selective solid food and we discussed what that would be. Will get a formal abdominal ultrasound. Tentatively for plan on scheduling laparoscopic cholecystectomy with cholangiogram. Also recommend that she take Prilosec OTC daily. If ultrasound does not show gallbladder pathology, will do HIDA scan. I have explained the procedure, risks, and aftercare of cholecystectomy. Risks include but are not limited to bleeding, infection, wound problems, anesthesia, diarrhea, bile leak, injury to common bile duct/liver/intestine. She seems to understand and agrees  with the plan.  Rose Confer, MD

## 2014-12-20 ENCOUNTER — Encounter (HOSPITAL_COMMUNITY): Payer: Self-pay | Admitting: General Surgery

## 2015-03-16 ENCOUNTER — Other Ambulatory Visit: Payer: Self-pay | Admitting: Obstetrics & Gynecology

## 2015-03-17 LAB — CYTOLOGY - PAP

## 2015-03-25 ENCOUNTER — Ambulatory Visit (INDEPENDENT_AMBULATORY_CARE_PROVIDER_SITE_OTHER): Payer: 59 | Admitting: Podiatry

## 2015-03-25 ENCOUNTER — Encounter: Payer: Self-pay | Admitting: Podiatry

## 2015-03-25 ENCOUNTER — Ambulatory Visit (INDEPENDENT_AMBULATORY_CARE_PROVIDER_SITE_OTHER): Payer: 59

## 2015-03-25 VITALS — BP 131/80 | HR 82 | Resp 16 | Ht 69.0 in | Wt 193.0 lb

## 2015-03-25 DIAGNOSIS — L84 Corns and callosities: Secondary | ICD-10-CM | POA: Diagnosis not present

## 2015-03-25 DIAGNOSIS — M2040 Other hammer toe(s) (acquired), unspecified foot: Secondary | ICD-10-CM | POA: Diagnosis not present

## 2015-03-25 DIAGNOSIS — M779 Enthesopathy, unspecified: Secondary | ICD-10-CM

## 2015-03-25 DIAGNOSIS — G8918 Other acute postprocedural pain: Secondary | ICD-10-CM | POA: Diagnosis not present

## 2015-03-25 DIAGNOSIS — M2010 Hallux valgus (acquired), unspecified foot: Secondary | ICD-10-CM | POA: Diagnosis not present

## 2015-03-25 MED ORDER — TRIAMCINOLONE ACETONIDE 10 MG/ML IJ SUSP
10.0000 mg | Freq: Once | INTRAMUSCULAR | Status: AC
  Administered 2015-03-25: 10 mg

## 2015-03-25 NOTE — Progress Notes (Signed)
Subjective:     Patient ID: Rose Boyer, female   DOB: 03-26-1956, 59 y.o.   MRN: [id number]  HPI patient states I've had bunion surgery 8 years ago and I am having a lot of problems between my big toe second toe of my right foot of approximate 6 months duration and also I get very painful calluses underneath with inflammation and inability to walk comfortably. I know that I may need surgery again but I am most concerned about between my toes   Review of Systems  All other systems reviewed and are negative.      Objective:   Physical Exam  Constitutional: She is oriented to person, place, and time.  Cardiovascular: Intact distal pulses.   Musculoskeletal: Normal range of motion.  Neurological: She is oriented to person, place, and time.  Skin: Skin is warm.  Nursing note and vitals reviewed.  neurovascular status found to be intact with muscle strength adequate range of motion within normal limits. Patient's noted to have acute inflammation and pain between the hallux and second toes right with fluid buildup around the interphalangeal joint of the second toe with keratotic lesion formation. Also has plantar keratotic lesions with inflammation subsecond metatarsal bilateral that are painful when pressed     Assessment:     Structural deformity of the foot with hallux leaning against the second toe right with interphalangeal keratotic lesion formation due to bone pressure along with fluid buildup interphalangeal joint and structural deformity of both feet with inadequate correction left over right and plantar callus formation    Plan:     H&P and all conditions reviewed. Today I went ahead and I did a careful injection of the interphalangeal joint right second toe 2 mg [place] some Kenalog and then debrided lesions and applied padding to reduce pressure and also scanned for orthotics to reduce pressure against the plantar feet bilateral. Reappoint to recheck and may ultimately require  Akin-type osteotomy along with digital fusion

## 2015-03-25 NOTE — Progress Notes (Signed)
   Subjective:    Patient ID: Rose Boyer, female    DOB: 01/21/1956, 59 y.o.   MRN: [id number]  HPI Patient presents with callouses on their Left foot, great toe-lateral side and 2nd toe-medial side; x2 months.  Patient also presents with bunions; Bilateral. Had previous surgery-Right foot in 2006 and Left foot was in 2007. Pt would like to discuss future options. Pt used Dr. Meridee Score with [facility].    Review of Systems  HENT: Positive for hearing loss and tinnitus.   Neurological: Positive for weakness and numbness.  All other systems reviewed and are negative.      Objective:   Physical Exam        Assessment & Plan:

## 2015-04-24 ENCOUNTER — Ambulatory Visit: Payer: 59 | Admitting: *Deleted

## 2015-04-24 DIAGNOSIS — M779 Enthesopathy, unspecified: Secondary | ICD-10-CM

## 2015-04-24 NOTE — Progress Notes (Signed)
Patient ID: Rose Boyer, female   DOB: [birth year], 59 y.o.   MRN: [id number] Patient presents for orthotic pick up.  Verbal and written break in and wear instructions given.  Patient will follow up in 4 weeks if symptoms worsen or fail to improve.

## 2015-04-24 NOTE — Patient Instructions (Signed)

## 2015-04-29 ENCOUNTER — Other Ambulatory Visit: Payer: Self-pay | Admitting: Neurosurgery

## 2015-04-29 ENCOUNTER — Ambulatory Visit (INDEPENDENT_AMBULATORY_CARE_PROVIDER_SITE_OTHER): Payer: 59

## 2015-04-29 DIAGNOSIS — M4602 Spinal enthesopathy, cervical region: Secondary | ICD-10-CM

## 2015-04-29 DIAGNOSIS — Z6831 Body mass index (BMI) 31.0-31.9, adult: Secondary | ICD-10-CM

## 2015-05-04 ENCOUNTER — Other Ambulatory Visit: Payer: Self-pay | Admitting: Neurosurgery

## 2015-05-04 ENCOUNTER — Ambulatory Visit (INDEPENDENT_AMBULATORY_CARE_PROVIDER_SITE_OTHER): Payer: 59

## 2015-05-04 DIAGNOSIS — M5412 Radiculopathy, cervical region: Secondary | ICD-10-CM

## 2015-05-04 DIAGNOSIS — M4602 Spinal enthesopathy, cervical region: Secondary | ICD-10-CM

## 2015-05-04 DIAGNOSIS — M542 Cervicalgia: Secondary | ICD-10-CM

## 2015-05-04 DIAGNOSIS — M5023 Other cervical disc displacement, cervicothoracic region: Secondary | ICD-10-CM | POA: Diagnosis not present

## 2015-05-06 ENCOUNTER — Ambulatory Visit: Payer: 59 | Admitting: Podiatry

## 2015-05-07 ENCOUNTER — Ambulatory Visit (INDEPENDENT_AMBULATORY_CARE_PROVIDER_SITE_OTHER): Payer: 59 | Admitting: Podiatry

## 2015-05-07 ENCOUNTER — Encounter: Payer: Self-pay | Admitting: Podiatry

## 2015-05-07 VITALS — BP 108/70 | HR 85 | Resp 16

## 2015-05-07 DIAGNOSIS — M2040 Other hammer toe(s) (acquired), unspecified foot: Secondary | ICD-10-CM | POA: Diagnosis not present

## 2015-05-07 DIAGNOSIS — M2010 Hallux valgus (acquired), unspecified foot: Secondary | ICD-10-CM | POA: Diagnosis not present

## 2015-05-07 DIAGNOSIS — L84 Corns and callosities: Secondary | ICD-10-CM

## 2015-05-07 NOTE — Progress Notes (Signed)
Subjective:     Patient ID: Rose Boyer, female   DOB: Mar 16, 1956, 59 y.o.   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 Will be seen back as needed

## 2015-05-11 ENCOUNTER — Other Ambulatory Visit: Payer: Self-pay | Admitting: Neurosurgery

## 2015-05-11 ENCOUNTER — Ambulatory Visit
Admission: RE | Admit: 2015-05-11 | Discharge: 2015-05-11 | Disposition: A | Discharge status: Home / Self Care | Payer: 59 | Source: Ambulatory Visit | Attending: Neurosurgery | Admitting: Neurosurgery

## 2015-05-11 DIAGNOSIS — M5412 Radiculopathy, cervical region: Secondary | ICD-10-CM

## 2015-05-11 MED ORDER — IOHEXOL 300 MG/ML  SOLN
1.0000 mL | Freq: Once | INTRAMUSCULAR | Status: DC | PRN
  Administered 2015-05-11: 1 mL via EPIDURAL

## 2015-05-11 MED ORDER — TRIAMCINOLONE ACETONIDE 40 MG/ML IJ SUSP (RADIOLOGY)
60.0000 mg | Freq: Once | INTRAMUSCULAR | Status: AC
Start: 2015-05-11 — End: 2015-05-11
  Administered 2015-05-11: 60 mg via EPIDURAL

## 2015-05-11 NOTE — Discharge Instructions (Signed)

## 2015-07-09 ENCOUNTER — Ambulatory Visit (HOSPITAL_BASED_OUTPATIENT_CLINIC_OR_DEPARTMENT_OTHER)
Admission: RE | Admit: 2015-07-09 | Discharge: 2015-07-09 | Disposition: A | Discharge status: Home / Self Care | Payer: 59 | Source: Ambulatory Visit | Attending: Obstetrics & Gynecology | Admitting: Obstetrics & Gynecology

## 2015-07-09 ENCOUNTER — Other Ambulatory Visit (HOSPITAL_BASED_OUTPATIENT_CLINIC_OR_DEPARTMENT_OTHER): Payer: Self-pay | Admitting: Obstetrics & Gynecology

## 2015-07-09 DIAGNOSIS — Z1231 Encounter for screening mammogram for malignant neoplasm of breast: Secondary | ICD-10-CM | POA: Diagnosis not present

## 2015-08-06 MED FILL — IBUPROFEN 800 MG TABLET: 800 | 30 days supply | Qty: 60 | Fill #1

## 2015-08-06 MED FILL — BUPROPION HCL XL 300 MG TAB: 300 | 90 days supply | Qty: 90 | Fill #1

## 2015-08-06 MED FILL — ARMOUR THYROID 90 MG TABLET: 90 | 90 days supply | Qty: 90 | Fill #3

## 2015-08-07 MED FILL — HYDROCHLOROTHIAZIDE 50 MG T: 50 | 30 days supply | Qty: 60 | Fill #0

## 2015-08-07 MED FILL — oxyCODONE HCL 5 MG TABS: 5 | 15 days supply | Qty: 90 | Fill #0

## 2015-08-26 MED FILL — IBUPROFEN 800 MG TABLET: 800 | 30 days supply | Qty: 60 | Fill #0

## 2015-08-28 MED FILL — oxyCODONE HCL 5 MG TABS: 5 | 15 days supply | Qty: 90 | Fill #0

## 2015-09-10 MED FILL — HYDROCHLOROTHIAZIDE 50 MG T: 50 | 30 days supply | Qty: 60 | Fill #0

## 2015-09-10 MED FILL — ALPRAZolam 0.5 MG TABS: 0.5 | 30 days supply | Qty: 90 | Fill #1

## 2015-09-15 MED FILL — oxyCODONE HCL 5 MG TABS: 5 | 15 days supply | Qty: 90 | Fill #0

## 2015-09-30 MED FILL — oxyCODONE HCL 5 MG TABS: 5 | 15 days supply | Qty: 90 | Fill #0

## 2015-10-12 DIAGNOSIS — E039 Hypothyroidism, unspecified: Secondary | ICD-10-CM | POA: Diagnosis not present

## 2015-10-13 MED FILL — oxyCODONE HCL 5 MG TABS: 5 | 15 days supply | Qty: 90 | Fill #0

## 2015-10-14 MED FILL — IBUPROFEN 800 MG TABLET: 800 | 30 days supply | Qty: 60 | Fill #1

## 2015-10-16 ENCOUNTER — Other Ambulatory Visit (HOSPITAL_BASED_OUTPATIENT_CLINIC_OR_DEPARTMENT_OTHER): Payer: Self-pay | Admitting: Endocrinology

## 2015-10-16 DIAGNOSIS — E039 Hypothyroidism, unspecified: Secondary | ICD-10-CM | POA: Diagnosis not present

## 2015-10-16 DIAGNOSIS — E049 Nontoxic goiter, unspecified: Secondary | ICD-10-CM

## 2015-10-19 ENCOUNTER — Ambulatory Visit (HOSPITAL_BASED_OUTPATIENT_CLINIC_OR_DEPARTMENT_OTHER)
Admission: RE | Admit: 2015-10-19 | Discharge: 2015-10-19 | Disposition: A | Discharge status: Home / Self Care | Payer: 59 | Source: Ambulatory Visit | Attending: Endocrinology | Admitting: Endocrinology

## 2015-10-19 DIAGNOSIS — E049 Nontoxic goiter, unspecified: Secondary | ICD-10-CM | POA: Diagnosis present

## 2015-10-19 DIAGNOSIS — E042 Nontoxic multinodular goiter: Secondary | ICD-10-CM | POA: Insufficient documentation

## 2015-10-19 DIAGNOSIS — E010 Iodine-deficiency related diffuse (endemic) goiter: Secondary | ICD-10-CM | POA: Diagnosis not present

## 2015-10-29 MED FILL — oxyCODONE HCL 5 MG TABS: 5 | 15 days supply | Qty: 90 | Fill #0

## 2015-11-04 MED FILL — ALPRAZolam 0.5 MG TABS: 0.5 | 30 days supply | Qty: 90 | Fill #0

## 2015-11-04 MED FILL — HYDROCHLOROTHIAZIDE 50 MG T: 50 | 30 days supply | Qty: 60 | Fill #1

## 2015-11-04 MED FILL — BUPROPION HCL XL 300 MG TAB: 300 | 90 days supply | Qty: 90 | Fill #2

## 2015-11-05 MED FILL — ARMOUR THYROID 90 MG TABLET: 90 | 90 days supply | Qty: 90 | Fill #0

## 2015-11-11 MED FILL — oxyCODONE HCL 5 MG TABS: 5 | 15 days supply | Qty: 90 | Fill #0

## 2015-11-19 ENCOUNTER — Encounter: Payer: Self-pay | Admitting: Gastroenterology

## 2015-11-26 MED FILL — oxyCODONE HCL 5 MG TABS: 5 | 15 days supply | Qty: 90 | Fill #0

## 2015-12-08 MED FILL — oxyCODONE HCL 5 MG TABS: 5 | 15 days supply | Qty: 90 | Fill #0

## 2015-12-09 MED FILL — IBUPROFEN 800 MG TABLET: 800 | 30 days supply | Qty: 60 | Fill #2

## 2015-12-28 MED FILL — oxyCODONE HCL 5 MG TABS: 5 | 15 days supply | Qty: 90 | Fill #0

## 2016-01-08 MED FILL — IBUPROFEN 800 MG TABLET: 800 | 30 days supply | Qty: 60 | Fill #2

## 2016-01-08 MED FILL — HYDROCHLOROTHIAZIDE 50 MG T: 50 | 30 days supply | Qty: 60 | Fill #2

## 2016-01-08 MED FILL — oxyCODONE HCL 5 MG TABS: 5 | 15 days supply | Qty: 90 | Fill #0

## 2016-01-13 DIAGNOSIS — M5010 Cervical disc disorder with radiculopathy, unspecified cervical region: Secondary | ICD-10-CM | POA: Diagnosis not present

## 2016-01-13 DIAGNOSIS — M47812 Spondylosis without myelopathy or radiculopathy, cervical region: Secondary | ICD-10-CM | POA: Diagnosis not present

## 2016-01-13 DIAGNOSIS — M542 Cervicalgia: Secondary | ICD-10-CM | POA: Diagnosis not present

## 2016-01-13 DIAGNOSIS — M5412 Radiculopathy, cervical region: Secondary | ICD-10-CM | POA: Diagnosis not present

## 2016-01-15 MED FILL — oxyCODONE HCL 10 MG TABS: 10 | 30 days supply | Qty: 120 | Fill #0

## 2016-01-27 MED FILL — ARMOUR THYROID 90 MG TABLET: 90 | 90 days supply | Qty: 90 | Fill #1

## 2016-01-27 MED FILL — BUPROPION HCL XL 300 MG TAB: 300 | 90 days supply | Qty: 90 | Fill #3

## 2016-01-28 MED FILL — HYDROCHLOROTHIAZIDE 50 MG T: 50 | 30 days supply | Qty: 60 | Fill #3

## 2016-01-28 MED FILL — IBUPROFEN 800 MG TABLET: 800 | 30 days supply | Qty: 60 | Fill #0

## 2016-02-08 MED FILL — oxyCODONE HCL 10 MG TABS: 10 | 30 days supply | Qty: 120 | Fill #0

## 2016-02-16 MED FILL — ALPRAZolam 0.5 MG TABS: 0.5 | 30 days supply | Qty: 90 | Fill #1

## 2016-02-19 MED FILL — IBUPROFEN 800 MG TABLET: 800 | 30 days supply | Qty: 60 | Fill #1

## 2016-02-25 ENCOUNTER — Encounter: Payer: Self-pay | Admitting: Pulmonary Disease

## 2016-02-25 ENCOUNTER — Ambulatory Visit (INDEPENDENT_AMBULATORY_CARE_PROVIDER_SITE_OTHER): Payer: 59 | Admitting: Pulmonary Disease

## 2016-02-25 VITALS — BP 115/78 | HR 78 | Ht 69.0 in | Wt 210.0 lb

## 2016-02-25 DIAGNOSIS — Z9989 Dependence on other enabling machines and devices: Secondary | ICD-10-CM | POA: Insufficient documentation

## 2016-02-25 DIAGNOSIS — R0683 Snoring: Secondary | ICD-10-CM | POA: Diagnosis not present

## 2016-02-25 DIAGNOSIS — G4733 Obstructive sleep apnea (adult) (pediatric): Secondary | ICD-10-CM | POA: Diagnosis not present

## 2016-02-25 NOTE — Assessment & Plan Note (Signed)
Given excessive daytime somnolence, narrow pharyngeal exam, witnessed apneas & loud snoring, obstructive sleep apnea is very likely & an overnight polysomnogram will be scheduled as a split study. The pathophysiology of obstructive sleep apnea , it's cardiovascular consequences & modes of treatment including CPAP were discused with the patient in detail & they evidenced understanding. Pretest probability is intermediate. Would prefer attendance study due to narcotics on her med list and possibility of central events Even if if she has simple snoring, she would like some therapy

## 2016-02-25 NOTE — Patient Instructions (Signed)
Schedule Sleep study    We discussed treatment options including CPAP and oral appliance

## 2016-02-25 NOTE — Progress Notes (Signed)
Subjective:    Patient ID: Rose Boyer, female    DOB: 21-Feb-1956, 60 y.o.   MRN: [id number]  HPI  Chief Complaint  Patient presents with  . 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 Her husband and other family members on a recent beach trip have commented on loud snoring. She reports snoring episodes that occasionally wake her up from sleep. She reports chronic in operable neck pain for which she's been maintained on oxycodone 4 times daily. She has developed some insomnia lately and it takes at least an hour to fall asleep  Epworth sleepiness score is 6 and she denies excessive daytime somnolence. She does report tiredness however and has to nap after getting home from work. Bedtime is between 10 and 11 PM, she often takes 0.5 mg of Xanax and oxycodone, in spite of which he takes about an hour to fall asleep. She sleeps on her stomach with one pillow. She reports 2-3 nocturnal awakenings including one bathroom visits and is out of bed by 7 AM feeling groggy occasionally with dryness of mouth but denies headaches.  She's lost 30 pounds over the last 1 year There is no history suggestive of cataplexy, sleep paralysis or parasomnias  She takes thiazide for Mnire's disease. She is taking care of her elderly parents-father has Alzheimer's, drives every week into [place] and denies any problems driving. She quit smoking in 1999-less than 20 pack years   Past Medical History:  Diagnosis Date  . Anxiety   . Arthritis    hands   . Complication of anesthesia    wakes up crying   . Depression    takes Wellbutrin daily  . GERD (gastroesophageal reflux disease)    takes OTC med as needed  . Headache(784.0)    rarely  . History of colon polyps   .  Hypothyroidism   . Impaired hearing    left;has hearing aids but doesn't wear them  . Meniere disease    takes HCTZ daily  . Peripheral edema   . Spinal headache   . Thyroid disease    takes Thyroid Armour daily-hypothyroidism  . 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 Weakness    numbness and tingling   Past Surgical History:  Procedure Laterality Date  . ANTERIOR CERVICAL DECOMP/DISCECTOMY FUSION N/A 09/03/2013   Procedure: ANTERIOR CERVICAL DECOMPRESSION/DISCECTOMY FUSION 2 LEVELS;  Surgeon: Erline Levine, MD;  Location: [facility];  Service: Neurosurgery;  Laterality: N/A;  C4-5 C5-6 Anterior cervical decompression/diskectomy/fusion  . BUNIONECTOMY Bilateral   . CESAREAN SECTION     twice  . CHOLECYSTECTOMY N/A 12/19/2014   Procedure: LAPAROSCOPIC CHOLECYSTECTOMY WITH INTRAOPERATIVE CHOLANGIOGRAM;  Surgeon: Jackolyn Confer, MD;  Location: [facility];  Service: General;  Laterality: N/A;  . COLONOSCOPY    . COLONOSCOPY W/ BIOPSIES AND POLYPECTOMY    . DILATION AND CURETTAGE OF UTERUS    . TONSILLECTOMY     and adenoids as a child  . TUBAL LIGATION       No Known Allergies   Social History   Social History  . Marital status: Married    Spouse name: N/A  . Number of children: N/A  . Years of education: N/A   Occupational History  .  Not on file.   Social History Main Topics  . Smoking status: Former Research scientist (life sciences)  . Smokeless tobacco: Never Used     Comment: quit smoking Mar 8,1999  . Alcohol use 0.0 oz/week     Comment: occasionally wine  . Drug use: No  . Sexual activity: Yes    Partners: Male    Birth control/ protection: Post-menopausal   Other Topics Concern  . Not on file   Social History Narrative  . No narrative on file    Family History  Problem Relation Age of Onset  . Colon cancer Paternal Grandmother   . Breast cancer Mother   . Prostate cancer Father   . Seizures Father   . Hypertension Father   . Hyperlipidemia Neg Hx   . Heart attack Neg Hx   .  Diabetes Neg Hx   . Sudden death Neg Hx      Review of Systems  Constitutional: Negative for chills, fever and unexpected weight change.  HENT: Negative for congestion, dental problem, ear pain, nosebleeds, postnasal drip, rhinorrhea, sinus pressure, sneezing, sore throat, trouble swallowing and voice change.   Eyes: Negative for visual disturbance.  Respiratory: Negative for cough, choking and shortness of breath.   Cardiovascular: Negative for chest pain and leg swelling.  Gastrointestinal: Negative for abdominal pain, diarrhea and vomiting.  Genitourinary: Negative for difficulty urinating.  Musculoskeletal: Negative for arthralgias.  Skin: Negative for rash.  Neurological: Negative for tremors, syncope and headaches.  Hematological: Does not bruise/bleed easily.       Objective:   Physical Exam  Gen. Pleasant, well-nourished, in no distress ENT - no lesions, no post nasal drip, class II airway Neck: No JVD, no thyromegaly, no carotid bruits Lungs: no use of accessory muscles, no dullness to percussion, clear without rales or rhonchi  Cardiovascular: Rhythm regular, heart sounds  normal, no murmurs or gallops, no peripheral edema Musculoskeletal: No deformities, no cyanosis or clubbing        Assessment & Plan:

## 2016-02-29 MED FILL — oxyCODONE HCL 10 MG TABS: 10 | 11 days supply | Qty: 45 | Fill #0

## 2016-03-01 MED FILL — HYDROCHLOROTHIAZIDE 50 MG T: 50 | 30 days supply | Qty: 60 | Fill #4

## 2016-03-02 ENCOUNTER — Other Ambulatory Visit: Payer: Self-pay | Admitting: Anesthesiology

## 2016-03-02 DIAGNOSIS — M5414 Radiculopathy, thoracic region: Secondary | ICD-10-CM | POA: Diagnosis not present

## 2016-03-02 DIAGNOSIS — M546 Pain in thoracic spine: Secondary | ICD-10-CM

## 2016-03-02 DIAGNOSIS — M542 Cervicalgia: Secondary | ICD-10-CM | POA: Diagnosis not present

## 2016-03-02 MED FILL — NUCYNTA 75 MG TABLET: 75 | 20 days supply | Qty: 120 | Fill #0

## 2016-03-03 ENCOUNTER — Other Ambulatory Visit: Payer: Self-pay | Admitting: Anesthesiology

## 2016-03-03 DIAGNOSIS — M5414 Radiculopathy, thoracic region: Secondary | ICD-10-CM

## 2016-03-07 ENCOUNTER — Ambulatory Visit (INDEPENDENT_AMBULATORY_CARE_PROVIDER_SITE_OTHER): Payer: 59

## 2016-03-07 DIAGNOSIS — M5033 Other cervical disc degeneration, cervicothoracic region: Secondary | ICD-10-CM | POA: Diagnosis not present

## 2016-03-07 DIAGNOSIS — M5414 Radiculopathy, thoracic region: Secondary | ICD-10-CM

## 2016-03-07 DIAGNOSIS — M5134 Other intervertebral disc degeneration, thoracic region: Secondary | ICD-10-CM

## 2016-03-07 DIAGNOSIS — M4804 Spinal stenosis, thoracic region: Secondary | ICD-10-CM | POA: Diagnosis not present

## 2016-03-16 DIAGNOSIS — Z01419 Encounter for gynecological examination (general) (routine) without abnormal findings: Secondary | ICD-10-CM | POA: Diagnosis not present

## 2016-03-16 DIAGNOSIS — Z6831 Body mass index (BMI) 31.0-31.9, adult: Secondary | ICD-10-CM | POA: Diagnosis not present

## 2016-03-23 DIAGNOSIS — Z13220 Encounter for screening for lipoid disorders: Secondary | ICD-10-CM | POA: Diagnosis not present

## 2016-03-23 DIAGNOSIS — M542 Cervicalgia: Secondary | ICD-10-CM | POA: Diagnosis not present

## 2016-03-23 DIAGNOSIS — M5414 Radiculopathy, thoracic region: Secondary | ICD-10-CM | POA: Diagnosis not present

## 2016-03-23 MED FILL — NUCYNTA 100 MG TABLET: 100 | 30 days supply | Qty: 150 | Fill #0

## 2016-03-23 MED FILL — oxyCODONE HCL 10 MG TABS: 10 | 17 days supply | Qty: 50 | Fill #0

## 2016-03-24 ENCOUNTER — Other Ambulatory Visit: Payer: Self-pay | Admitting: Anesthesiology

## 2016-03-24 DIAGNOSIS — M542 Cervicalgia: Secondary | ICD-10-CM

## 2016-03-30 DIAGNOSIS — H524 Presbyopia: Secondary | ICD-10-CM | POA: Diagnosis not present

## 2016-04-11 ENCOUNTER — Other Ambulatory Visit: Payer: 59

## 2016-04-12 ENCOUNTER — Encounter: Payer: Self-pay | Admitting: *Deleted

## 2016-04-12 ENCOUNTER — Emergency Department (INDEPENDENT_AMBULATORY_CARE_PROVIDER_SITE_OTHER): Payer: 59

## 2016-04-12 ENCOUNTER — Emergency Department
Admission: EM | Admit: 2016-04-12 | Discharge: 2016-04-12 | Disposition: A | Discharge status: Home / Self Care | Payer: 59 | Source: Home / Self Care | Attending: Family Medicine | Admitting: Family Medicine

## 2016-04-12 DIAGNOSIS — R05 Cough: Secondary | ICD-10-CM

## 2016-04-12 DIAGNOSIS — R070 Pain in throat: Secondary | ICD-10-CM

## 2016-04-12 DIAGNOSIS — R059 Cough, unspecified: Secondary | ICD-10-CM

## 2016-04-12 DIAGNOSIS — J069 Acute upper respiratory infection, unspecified: Secondary | ICD-10-CM | POA: Diagnosis not present

## 2016-04-12 MED ORDER — BENZONATATE 100 MG PO CAPS: 100.0000 mg | ORAL_CAPSULE | Freq: Three times a day (TID) | ORAL | 0 refills | Status: DC

## 2016-04-12 MED ORDER — AZITHROMYCIN 250 MG PO TABS: 250.0000 mg | ORAL_TABLET | Freq: Every day | ORAL | 0 refills | Status: DC

## 2016-04-12 MED FILL — AZITHROMYCIN 250 MG TABLET: 250 | 5 days supply | Qty: 6 | Fill #0

## 2016-04-12 MED FILL — BENZONATATE 100 MG CAPSULE: 100 | 4 days supply | Qty: 21 | Fill #0

## 2016-04-12 NOTE — Discharge Instructions (Signed)
°  You may take 400-600mg  Ibuprofen (Motrin) every 6-8 hours for fever and pain  Alternate with Tylenol  You may take 500mg  Tylenol every 4-6 hours as needed for fever and pain  Follow-up with your primary care provider next week for recheck of symptoms if not improving.  Be sure to drink plenty of fluids and rest, at least 8hrs of sleep a night, preferably more while you are sick. Return urgent care or go to closest ER if you cannot keep down fluids/signs of dehydration, fever not reducing with Tylenol, difficulty breathing/wheezing, stiff neck, worsening condition, or other concerns (see below)   Your symptoms are likely due to a virus such as the common cold, however, if you developing worsening chest congestion with shortness of breath, persistent fever for 3 days, or symptoms not improving in 4-5 days, you may fill the antibiotic (azithromycin).  If you do fill the antibiotic,  please take antibiotics as prescribed and be sure to complete entire course even if you start to feel better to ensure infection does not come back.

## 2016-04-12 NOTE — ED Triage Notes (Signed)
Pt c/o 4 days of illness that started with a sore throat which is now resolved. Developed dry cough, productive at times, achy and fatigued. Taken generic cold medication otc and about 5 total Amox.

## 2016-04-12 NOTE — ED Provider Notes (Signed)
CSN: [id number]     Arrival date & time 04/12/16  [id number] History   First MD Initiated Contact with Patient 04/12/16 1008     Chief Complaint  Patient presents with  . Cough  . Fatigue   (Consider location/radiation/quality/duration/timing/severity/associated sxs/prior Treatment) HPI Rose Boyer is a 60 y.o. female presenting to UC with c/o 4-5 days of body aches, sore throat that has nearly resolved and persistent cough that is occasionally productive. Pt reports feeling body aches and fatigue. Pt states "I just feel drained."  She did start taking leftover amoxicillin, 1 tab daily for the last 4 days, she has no more of the antibiotic. States she thought she would feel better by now. Occasional subjective fever.  Denies n/v/d.    Past Medical History:  Diagnosis Date  . Anxiety   . Arthritis    hands   . Complication of anesthesia    wakes up crying   . Depression    takes Wellbutrin daily  . GERD (gastroesophageal reflux disease)    takes OTC med as needed  . Headache(784.0)    rarely  . History of colon polyps   . Hypothyroidism   . Impaired hearing    left;has hearing aids but doesn't wear them  . Meniere disease    takes HCTZ daily  . Peripheral edema   . Spinal headache   . Thyroid disease    takes Thyroid Armour daily-hypothyroidism  . 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 Weakness    numbness and tingling   Past Surgical History:  Procedure Laterality Date  . ANTERIOR CERVICAL DECOMP/DISCECTOMY FUSION N/A 09/03/2013   Procedure: ANTERIOR CERVICAL DECOMPRESSION/DISCECTOMY FUSION 2 LEVELS;  Surgeon: Erline Levine, MD;  Location: [facility];  Service: Neurosurgery;  Laterality: N/A;  C4-5 C5-6 Anterior cervical decompression/diskectomy/fusion  . BUNIONECTOMY Bilateral   . CESAREAN SECTION     twice  . CHOLECYSTECTOMY N/A 12/19/2014   Procedure: LAPAROSCOPIC CHOLECYSTECTOMY WITH INTRAOPERATIVE CHOLANGIOGRAM;  Surgeon: Jackolyn Confer, MD;  Location: [facility];   Service: General;  Laterality: N/A;  . COLONOSCOPY    . COLONOSCOPY W/ BIOPSIES AND POLYPECTOMY    . DILATION AND CURETTAGE OF UTERUS    . TONSILLECTOMY     and adenoids as a child  . TUBAL LIGATION     Family History  Problem Relation Age of Onset  . Colon cancer Paternal Grandmother   . Breast cancer Mother   . Prostate cancer Father   . Seizures Father   . Hypertension Father   . Hyperlipidemia Neg Hx   . Heart attack Neg Hx   . Diabetes Neg Hx   . Sudden death Neg Hx    Social History  Substance Use Topics  . Smoking status: Former Research scientist (life sciences)  . Smokeless tobacco: Never Used     Comment: quit smoking Mar 8,1999  . Alcohol use 0.0 oz/week     Comment: occasionally wine   OB History    Gravida Para Term Preterm AB Living   4 3 3   1 3    SAB TAB Ectopic Multiple Live Births   1     1       Review of Systems  Constitutional: Positive for fatigue. Negative for chills and fever.  HENT: Positive for congestion, postnasal drip, rhinorrhea and sore throat. Negative for ear pain, trouble swallowing and voice change.   Respiratory: Positive for cough. Negative for shortness of breath.   Cardiovascular: Negative for chest pain and  palpitations.  Gastrointestinal: Negative for abdominal pain, diarrhea, nausea and vomiting.  Musculoskeletal: Positive for arthralgias and myalgias. Negative for back pain.  Skin: Negative for rash.  Neurological: Positive for weakness ( generalized). Negative for dizziness, light-headedness and headaches.    Allergies  Review of patient's allergies indicates no known allergies.  Home Medications   Prior to Admission medications   Medication Sig Start Date End Date Taking? Authorizing Provider  buPROPion (WELLBUTRIN XL) 300 MG 24 hr tablet Take 300 mg by mouth daily.     Yes Historical Provider, MD  hydrochlorothiazide 50 MG tablet Take 50 mg by mouth daily.     Yes Historical Provider, MD  oxyCODONE (OXY IR/ROXICODONE) 5 MG immediate release  tablet Take 1-2 tablets (5-10 mg total) by mouth every 4 (four) hours as needed for severe pain. 12/19/14  Yes Jackolyn Confer, MD  tapentadol [facility]) 100 MG 12 hr tablet Take 100 mg by mouth every 12 (twelve) hours.   Yes Historical Provider, MD  thyroid (ARMOUR) 90 MG tablet Take 90 mg by mouth daily.     Yes Historical Provider, MD  ALPRAZolam Duanne Moron) 0.5 MG tablet  04/13/15   Historical Provider, MD  azithromycin (ZITHROMAX) 250 MG tablet Take 1 tablet (250 mg total) by mouth daily. Take first 2 tablets together, then 1 every day until finished. 04/12/16   Noland Fordyce, PA-C  benzonatate (TESSALON) 100 MG capsule Take 1-2 capsules (100-200 mg total) by mouth every 8 (eight) hours. 04/12/16   Noland Fordyce, PA-C   Meds Ordered and Administered this Visit  Medications - No data to display  BP 123/81 (BP Location: Left Arm)   Pulse 80   Temp 98.4 F (36.9 C) (Oral)   Wt 206 lb (93.4 kg)   SpO2 97%   BMI 30.42 kg/m  No data found.   Physical Exam  Constitutional: She appears well-developed and well-nourished. No distress.  HENT:  Head: Normocephalic and atraumatic.  Right Ear: Tympanic membrane normal.  Left Ear: Tympanic membrane normal.  Nose: Nose normal.  Mouth/Throat: Uvula is midline and mucous membranes are normal. Posterior oropharyngeal erythema present. No oropharyngeal exudate or posterior oropharyngeal edema.  Eyes: Conjunctivae are normal. No scleral icterus.  Neck: Normal range of motion. Neck supple.  Cardiovascular: Normal rate, regular rhythm and normal heart sounds.   Pulmonary/Chest: Effort normal and breath sounds normal. No stridor. No respiratory distress. She has no wheezes. She has no rales.  Abdominal: Soft. She exhibits no distension. There is no tenderness.  Musculoskeletal: Normal range of motion.  Lymphadenopathy:    She has no cervical adenopathy.  Neurological: She is alert.  Skin: Skin is warm and dry. She is not diaphoretic.  Nursing note and  vitals reviewed.   Urgent Care Course   Clinical Course    Procedures (including critical care time)  Labs Review Labs Reviewed - No data to display  Imaging Review Dg Chest 2 View  Result Date: 04/12/2016 CLINICAL DATA:  Sore throat, cough, congestion EXAM: CHEST  2 VIEW COMPARISON:  None. FINDINGS: Cardiomediastinal silhouette is unremarkable. No acute infiltrate or pleural effusion. No pulmonary edema. Metallic fixation plate noted cervical spine. IMPRESSION: No active cardiopulmonary disease. Electronically Signed   By: Lahoma Crocker M.D.   On: 04/12/2016 11:03     MDM   1. URI (upper respiratory infection)   2. Cough    Pt c/o 4-5 days of URI symptoms that are gradually worsening despite taking 1 tab of leftover amoxicillin daily  for 4 days.  Throat pain has resolved but she still feels fatigued and has cough.   With body aches and fatigue, question if pt has influenza, however, she is outside of 48 hour recommended treatment window for Tamiflu.  Will not do rapid flu test.   CXR: no evidence of pneumonia at this time.   Symptoms likely viral, encouraged symptomatic treatment. Rx: tessalon, prescription to hold for azithromycin. May fill if persistent fever develops or symptoms not improving in 4-5 days. Encouraged fluids and rest.  Patient verbalized understanding and agreement with treatment plan.    Noland Fordyce, PA-C 04/12/16 1208

## 2016-04-13 ENCOUNTER — Encounter (HOSPITAL_BASED_OUTPATIENT_CLINIC_OR_DEPARTMENT_OTHER): Payer: 59

## 2016-04-14 ENCOUNTER — Telehealth: Payer: Self-pay | Admitting: Emergency Medicine

## 2016-04-14 MED FILL — predniSONE 20 MG TABS: 20 | 5 days supply | Qty: 10 | Fill #0

## 2016-04-14 NOTE — Telephone Encounter (Signed)
Patient calls saying her cough and congestion has not improved. Spoke with Dr.Beese who suggests plain Mucinex 1200 bid with lots of water; tessalon only HS; and may start prednisone 20mg  bid x 5 days; called to [place]. pak

## 2016-04-18 ENCOUNTER — Ambulatory Visit (INDEPENDENT_AMBULATORY_CARE_PROVIDER_SITE_OTHER): Payer: 59

## 2016-04-18 DIAGNOSIS — M5023 Other cervical disc displacement, cervicothoracic region: Secondary | ICD-10-CM

## 2016-04-18 DIAGNOSIS — M542 Cervicalgia: Secondary | ICD-10-CM

## 2016-04-18 DIAGNOSIS — M5031 Other cervical disc degeneration,  high cervical region: Secondary | ICD-10-CM | POA: Diagnosis not present

## 2016-04-18 DIAGNOSIS — M50221 Other cervical disc displacement at C4-C5 level: Secondary | ICD-10-CM | POA: Diagnosis not present

## 2016-04-27 DIAGNOSIS — M4802 Spinal stenosis, cervical region: Secondary | ICD-10-CM | POA: Diagnosis not present

## 2016-04-27 DIAGNOSIS — M542 Cervicalgia: Secondary | ICD-10-CM | POA: Diagnosis not present

## 2016-04-27 DIAGNOSIS — M5412 Radiculopathy, cervical region: Secondary | ICD-10-CM | POA: Diagnosis not present

## 2016-04-27 DIAGNOSIS — M5020 Other cervical disc displacement, unspecified cervical region: Secondary | ICD-10-CM | POA: Diagnosis not present

## 2016-04-27 DIAGNOSIS — Z6831 Body mass index (BMI) 31.0-31.9, adult: Secondary | ICD-10-CM | POA: Diagnosis not present

## 2016-04-27 DIAGNOSIS — S129XXA Fracture of neck, unspecified, initial encounter: Secondary | ICD-10-CM | POA: Diagnosis not present

## 2016-04-27 DIAGNOSIS — M5010 Cervical disc disorder with radiculopathy, unspecified cervical region: Secondary | ICD-10-CM | POA: Diagnosis not present

## 2016-04-27 DIAGNOSIS — I10 Essential (primary) hypertension: Secondary | ICD-10-CM | POA: Diagnosis not present

## 2016-04-27 MED FILL — oxyCODONE HCL 10 MG TABS: 10 | 30 days supply | Qty: 120 | Fill #0

## 2016-04-28 ENCOUNTER — Other Ambulatory Visit: Payer: Self-pay | Admitting: Neurosurgery

## 2016-04-28 MED FILL — ARMOUR THYROID 90 MG TABLET: 90 | 90 days supply | Qty: 90 | Fill #2

## 2016-04-28 MED FILL — BUPROPION HCL XL 300 MG TAB: 300 | 90 days supply | Qty: 90 | Fill #0

## 2016-04-28 MED FILL — HYDROCHLOROTHIAZIDE 50 MG T: 50 | 90 days supply | Qty: 180 | Fill #0

## 2016-05-05 ENCOUNTER — Ambulatory Visit (HOSPITAL_BASED_OUTPATIENT_CLINIC_OR_DEPARTMENT_OTHER): Payer: 59 | Attending: Pulmonary Disease | Admitting: Pulmonary Disease

## 2016-05-05 VITALS — Ht 69.0 in | Wt 200.0 lb

## 2016-05-05 DIAGNOSIS — G4733 Obstructive sleep apnea (adult) (pediatric): Secondary | ICD-10-CM | POA: Diagnosis not present

## 2016-05-05 DIAGNOSIS — R0683 Snoring: Secondary | ICD-10-CM | POA: Diagnosis present

## 2016-05-11 ENCOUNTER — Encounter: Payer: Self-pay | Admitting: Gastroenterology

## 2016-05-13 ENCOUNTER — Telehealth: Payer: Self-pay | Admitting: Pulmonary Disease

## 2016-05-13 DIAGNOSIS — G4730 Sleep apnea, unspecified: Secondary | ICD-10-CM | POA: Diagnosis not present

## 2016-05-13 DIAGNOSIS — G4733 Obstructive sleep apnea (adult) (pediatric): Secondary | ICD-10-CM

## 2016-05-13 NOTE — Telephone Encounter (Signed)
PSG showed severe OSA corrected by CPAP Pl send rx for  CPAP therapy on 14 cm H2O with a Small size Resmed Full Face Mask AirFit F20 mask and heated humidification.  OV in 6wks with download with TP/ me

## 2016-05-13 NOTE — Procedures (Signed)
Patient Name: Rose Boyer, Rose Boyer Date: 05/05/2016 Gender: Female D.O.B: 22-Jun-1956 Age (years): 24 Referring Provider: Kara Mead MD, ABSM Height (inches): 69 Interpreting Physician: Kara Mead MD, ABSM Weight (lbs): 200 RPSGT: Laren Everts BMI: 30 MRN: [id number] Neck Size: 15.70   CLINICAL INFORMATION Sleep Study Type: Split Night CPAP Indication for sleep study: Excessive Daytime Sleepiness, Fatigue, Morning Headaches, OSA, Snoring Epworth Sleepiness Score:9   SLEEP STUDY TECHNIQUE As per the AASM Manual for the Scoring of Sleep and Associated Events v2.3 (April 2016) with a hypopnea requiring 4% desaturations. The channels recorded and monitored were frontal, central and occipital EEG, electrooculogram (EOG), submentalis EMG (chin), nasal and oral airflow, thoracic and abdominal wall motion, anterior tibialis EMG, snore microphone, electrocardiogram, and pulse oximetry. Continuous positive airway pressure (CPAP) was initiated when the patient met split night criteria and was titrated according to treat sleep-disordered breathing.   MEDICATIONS Medications self-administered by patient taken the night of the study : XANAX, OXYCODONE   RESPIRATORY PARAMETERS Diagnostic Total AHI (/hr): 48.9 RDI (/hr): 56.3 OA Index (/hr): 45.5 CA Index (/hr): 0.0 REM AHI (/hr): 120.0 NREM AHI (/hr): 44.3 Supine AHI (/hr): 103.8 Non-supine AHI (/hr): 34.14 Min O2 Sat (%): 86.00 Mean O2 (%): 94.92 Time below 88% (min): 1.1     Titration Optimal Pressure (cm): 14 AHI at Optimal Pressure (/hr): 6.3 Min O2 at Optimal Pressure (%): 86.0 Supine % at Optimal (%): 6 Sleep % at Optimal (%): 88     SLEEP ARCHITECTURE The recording time for the entire night was 396.9 minutes. During a baseline period of 183.7 minutes, the patient slept for 122.6 minutes in REM and nonREM, yielding a sleep efficiency of 66.8%. Sleep onset after lights out was 51.1 minutes with a REM latency of 85.0 minutes. The  patient spent 10.60% of the night in stage N1 sleep, 82.88% in stage N2 sleep, 0.41% in stage N3 and 6.12% in REM. During the titration period of 207.8 minutes, the patient slept for 187.5 minutes in REM and nonREM, yielding a sleep efficiency of 90.2%. Sleep onset after CPAP initiation was 6.4 minutes with a REM latency of 7.5 minutes. The patient spent 6.67% of the night in stage N1 sleep, 58.93% in stage N2 sleep, 0.00% in stage N3 and 34.40% in REM.   CARDIAC DATA The 2 lead EKG demonstrated sinus rhythm. The mean heart rate was 74.40 beats per minute. Other EKG findings include: PVCs.   LEG MOVEMENT DATA The total Periodic Limb Movements of Sleep (PLMS) were 13. The PLMS index was 2.51 .   IMPRESSIONS - Severe obstructive sleep apnea occurred during the diagnostic portion of the study (AHI = 48.9/hour). An optimal PAP pressure was selected for this patient ( 14 cm of water) - No significant central sleep apnea occurred during the diagnostic portion of the study (CAI = 0.0/hour). - Moderate oxygen desaturation was noted during the diagnostic portion of the study (Min O2 =86.00%). - The patient snored with Loud snoring volume during the diagnostic portion of the study. - EKG findings include PVCs. - Clinically significant periodic limb movements did not occur during sleep.    DIAGNOSIS - Obstructive Sleep Apnea (327.23 [G47.33 ICD-10])    RECOMMENDATIONS - Trial of CPAP therapy on 14 cm H2O with a Small size Resmed Full Face Mask AirFit F20 mask and heated humidification. - Avoid alcohol, sedatives and other CNS depressants that may worsen sleep apnea and disrupt normal sleep architecture. - Sleep hygiene should be reviewed to assess factors that  may improve sleep quality. - Weight management and regular exercise should be initiated or continued. - Return to Sleep Center for re-evaluation after 4 weeks of therapy   Kara Mead MD Board Certified in [place]

## 2016-05-23 MED FILL — oxyCODONE HCL 10 MG TABS: 10 | 26 days supply | Qty: 160 | Fill #0

## 2016-05-23 NOTE — Telephone Encounter (Signed)
Spoke with pt and advised of sleep study results per Dr Elsworth Soho. Order placed. Nothing further needed.

## 2016-05-30 ENCOUNTER — Telehealth: Payer: Self-pay | Admitting: Pulmonary Disease

## 2016-05-30 NOTE — Telephone Encounter (Signed)
Spoke with the pt  She is calling stating that she has not heard from [facility] regarding her CPAP  She states PCC's advised her to call here if she had not heard anything by now  I am showing that she order has been processed  Will forward to PCC's to handle, thanks

## 2016-05-31 ENCOUNTER — Telehealth: Payer: Self-pay | Admitting: Pulmonary Disease

## 2016-05-31 MED FILL — ALPRAZolam 0.5 MG TABS: 0.5 | 30 days supply | Qty: 60 | Fill #0

## 2016-05-31 MED FILL — IBUPROFEN 800 MG TABLET: 800 | 30 days supply | Qty: 60 | Fill #2

## 2016-05-31 NOTE — Telephone Encounter (Signed)
Advised the patient, will be contacted by [facility] to get enrolled into their system, and to set up CPAP.Rose Boyer

## 2016-05-31 NOTE — Telephone Encounter (Signed)
I called and spoke with pt.  I advised patient to contact DME [facility]) and tell them she is following up on the status of her CPAP order & wanting to set up her appt.  I gave the patient the phone number for [facility].  Pt voiced understanding of above.

## 2016-05-31 NOTE — Telephone Encounter (Signed)
Spoke with Corene Cornea with [facility], Corene Cornea confirmed that they do have the order.  Corene Cornea states he will get pt enrolled in their system and get pt set up.  LM for pt to inform her of this. Will await call back.

## 2016-06-02 ENCOUNTER — Ambulatory Visit: Payer: 59 | Admitting: *Deleted

## 2016-06-02 VITALS — Ht 69.0 in | Wt 212.8 lb

## 2016-06-02 DIAGNOSIS — Z86010 Personal history of colonic polyps: Secondary | ICD-10-CM

## 2016-06-02 MED ORDER — NA SULFATE-K SULFATE-MG SULF 17.5-3.13-1.6 GM/177ML PO SOLN: 1.0000 | Freq: Once | ORAL | 0 refills | Status: AC

## 2016-06-02 MED FILL — SUPREP BOWEL PREP KIT: 17.5-3.13-1 | 1 days supply | Qty: 354 | Fill #0

## 2016-06-02 NOTE — Progress Notes (Signed)
Denies allergies to eggs or soy products. Denies complications with sedation or anesthesia. Denies O2 use. Denies use of diet or weight loss medications.  Emmi instructions given for colonoscopy.  

## 2016-06-10 MED FILL — oxyCODONE HCL 10 MG TABS: 10 | 30 days supply | Qty: 120 | Fill #0

## 2016-06-10 MED FILL — OXYMORPHONE HCL ER 10 MG TA: 10 | 30 days supply | Qty: 60 | Fill #0

## 2016-06-21 NOTE — Pre-Procedure Instructions (Signed)
Rose Boyer  06/21/2016      [place], [place] - [address] [facility]. [address] [place] [address] Phone: [phone] Fax: Rose Boyer, Rose Boyer [address] Phone: [phone] Fax: [phone]    Your procedure is scheduled on Thursday, December 7th, 2017.  Report to [facility] Admitting at 5:30 A.M.   Call this number if you have problems the morning of Boyer:  [phone]   Remember:  Do not eat food or drink liquids after midnight.   Take these medicines the morning of Boyer with A SIP OF WATER: Bupropion (Wellbutrin XL), Oxycodone HCL if needed, Oxymorphone (Opana ER), Thyroid (Armour).  7 days prior to Boyer, stop taking: Aspirin, NSAIDS, Aleve, Naproxen, Ibuprofen, Advil, Motrin, BC's, Goody's, Fish oil, all herbal medications, and all vitamins.    Do not wear jewelry, make-up or nail polish.  Do not wear lotions, powders, or perfumes, or deoderant.  Do not shave 48 hours prior to Boyer.    Do not bring valuables to the hospital.  [facility] is not responsible for any belongings or valuables.  Contacts, dentures or bridgework may not be worn into Boyer.  Leave your suitcase in the car.  After Boyer it may be brought to your room.  For patients admitted to the hospital, discharge time will be determined by your treatment team.  Patients discharged the day of Boyer will not be allowed to drive home.   Special instructions:  Preparing for Boyer.   Rose Boyer  Before Boyer, you can play an important role. Because skin is not sterile, your skin needs to be as free of germs as possible. You can reduce the number of germs on your skin by washing with CHG (chlorahexidine gluconate) Soap before Boyer.  CHG is an antiseptic cleaner which kills germs  and bonds with the skin to continue killing germs even after washing.  Please do not use if you have an allergy to CHG or antibacterial soaps. If your skin becomes reddened/irritated stop using the CHG.  Do not shave (including legs and underarms) for at least 48 hours prior to first CHG shower. It is OK to shave your face.  Please follow these instructions carefully.   1. Shower the NIGHT BEFORE Boyer and the MORNING OF Boyer with CHG.   2. If you chose to wash your hair, wash your hair first as usual with your normal shampoo.  3. After you shampoo, rinse your hair and body thoroughly to remove the shampoo.  4. Use CHG as you would any other liquid soap. You can apply CHG directly to the skin and wash gently with a scrungie or a clean washcloth.   5. Apply the CHG Soap to your body ONLY FROM THE NECK DOWN.  Do not use on open wounds or open sores. Avoid contact with your eyes, ears, mouth and genitals (private parts). Wash genitals (private parts) with your normal soap.  6. Wash thoroughly, paying special attention to the area where your Boyer will be performed.  7. Thoroughly rinse your body with warm water from the neck down.  8. DO NOT shower/wash with your normal soap after using and rinsing off the CHG Soap.  9. Pat yourself dry with a CLEAN TOWEL.   10. Wear CLEAN PAJAMAS   11. Place CLEAN SHEETS  on your bed the night of your first shower and DO NOT SLEEP WITH PETS.  Day of Boyer: Do not apply any deodorants/lotions. Please wear clean clothes to the hospital/Boyer center.      Please read over the following fact sheets that you were given. MRSA Information

## 2016-06-22 ENCOUNTER — Encounter (HOSPITAL_COMMUNITY): Payer: Self-pay

## 2016-06-22 ENCOUNTER — Encounter (HOSPITAL_COMMUNITY)
Admission: RE | Admit: 2016-06-22 | Discharge: 2016-06-22 | Disposition: A | Discharge status: Home / Self Care | Payer: 59 | Source: Ambulatory Visit | Attending: Neurosurgery | Admitting: Neurosurgery

## 2016-06-22 DIAGNOSIS — Z01812 Encounter for preprocedural laboratory examination: Secondary | ICD-10-CM | POA: Diagnosis not present

## 2016-06-22 HISTORY — DX: Sleep apnea, unspecified: G47.30

## 2016-06-22 LAB — BASIC METABOLIC PANEL
ANION GAP: 11 (ref 5–15)
BUN: 15 mg/dL (ref 6–20)
CALCIUM: 9.8 mg/dL (ref 8.9–10.3)
CO2: 30 mmol/L (ref 22–32)
Chloride: 94 mmol/L — ABNORMAL LOW (ref 101–111)
Creatinine, Ser: 1.07 mg/dL — ABNORMAL HIGH (ref 0.44–1.00)
GFR calc Af Amer: >60 mL/min (ref >60)
GFR calc non Af Amer: 54 mL/min — ABNORMAL LOW (ref >60)
GLUCOSE: 102 mg/dL — AB (ref 65–99)
Potassium: 2.9 mmol/L — ABNORMAL LOW (ref 3.5–5.1)
Sodium: 135 mmol/L (ref 135–145)

## 2016-06-22 LAB — CBC
HCT: 41.2 % (ref 36.0–46.0)
HEMOGLOBIN: 14.2 g/dL (ref 12.0–15.0)
MCH: 30.2 pg (ref 26.0–34.0)
MCHC: 34.5 g/dL (ref 30.0–36.0)
MCV: 87.7 fL (ref 78.0–100.0)
Platelets: 308 10*3/uL (ref 150–400)
RBC: 4.7 MIL/uL (ref 3.87–5.11)
RDW: 12.3 % (ref 11.5–15.5)
WBC: 5.6 10*3/uL (ref 4.0–10.5)

## 2016-06-22 LAB — TYPE AND SCREEN
ABO/RH(D): A POS
Antibody Screen: NEGATIVE

## 2016-06-22 LAB — SURGICAL PCR SCREEN
MRSA, PCR: NEGATIVE
STAPHYLOCOCCUS AUREUS: NEGATIVE

## 2016-06-22 LAB — ABO/RH: ABO/RH(D): A POS

## 2016-06-22 NOTE — Progress Notes (Signed)
PCP - Dr. Evette Cristal (OB-Gyn) Cardiologist - denies  EKG - denies CXR - 04/12/16 Echo/stress test/cardiac cath - denies  Patient denies chest pain and shortness of breath at PAT appointment.  Patient informed nurse that her husband died unexpectedly within the past 2 weeks.  Patient tearful at appointment.  Nurse offered patient a consultation with social work or spiritual care team but patient refused.  Patient stated that she had a good support system with her sons and parents.  Patient will be going to [place] after surgery to stay with parents and sons.  Patient denied thoughts of harming herself or others and denied feelings of hopelessness.

## 2016-06-23 ENCOUNTER — Encounter: Payer: 59 | Admitting: Gastroenterology

## 2016-06-23 NOTE — Progress Notes (Signed)
Anesthesia Chart Review:  Pt is a 60 year old female scheduled for exploration of C4-6 fusion, removal of hardware, C5-6 redo ACDF, C3-4 and C6-7 ACDF on 06/30/2016 with Erline Levine, MD  - PCP is Evette Cristal, MD  PMH includes:  Hypothyroidism, OSA, Meniere's diease, GERD. Former smoker. BMI 29. S/p cholecystectomy 12/19/14. S/p acdf 09/03/13.   Anesthesia history includes spinal headache  Medications include: hctz, armour  Preoperative labs reviewed.  K 2.9. Notified Jessica in Dr. Melven Sartorius office. Will recheck DOS.   CXR 04/12/16: No active cardiopulmonary disease.  If labs acceptable DOS, I anticipate pt can proceed as scheduled.   Willeen Cass, FNP-BC [facility] Short Stay Surgical Center/Anesthesiology Phone: [phone] 06/23/2016 4:09 PM

## 2016-06-24 MED FILL — POTASSIUM CL ER 20 MEQ TABL: 20 | 3 days supply | Qty: 6 | Fill #0

## 2016-06-28 DIAGNOSIS — G4733 Obstructive sleep apnea (adult) (pediatric): Secondary | ICD-10-CM | POA: Diagnosis not present

## 2016-06-30 ENCOUNTER — Inpatient Hospital Stay (HOSPITAL_COMMUNITY): Payer: 59 | Admitting: Emergency Medicine

## 2016-06-30 ENCOUNTER — Encounter (HOSPITAL_COMMUNITY)
Admission: RE | Disposition: A | Discharge status: Home / Self Care | Payer: Self-pay | Source: Ambulatory Visit | Attending: Neurosurgery

## 2016-06-30 ENCOUNTER — Encounter (HOSPITAL_COMMUNITY): Payer: Self-pay | Admitting: *Deleted

## 2016-06-30 ENCOUNTER — Ambulatory Visit (HOSPITAL_COMMUNITY)
Admission: RE | Admit: 2016-06-30 | Discharge: 2016-07-01 | Disposition: A | Discharge status: Home / Self Care | Payer: 59 | Source: Ambulatory Visit | Attending: Neurosurgery | Admitting: Neurosurgery

## 2016-06-30 ENCOUNTER — Inpatient Hospital Stay (HOSPITAL_COMMUNITY): Payer: 59

## 2016-06-30 DIAGNOSIS — Z87891 Personal history of nicotine dependence: Secondary | ICD-10-CM | POA: Diagnosis not present

## 2016-06-30 DIAGNOSIS — Y793 Surgical instruments, materials and orthopedic devices (including sutures) associated with adverse incidents: Secondary | ICD-10-CM | POA: Insufficient documentation

## 2016-06-30 DIAGNOSIS — M4802 Spinal stenosis, cervical region: Secondary | ICD-10-CM | POA: Insufficient documentation

## 2016-06-30 DIAGNOSIS — Y838 Other surgical procedures as the cause of abnormal reaction of the patient, or of later complication, without mention of misadventure at the time of the procedure: Secondary | ICD-10-CM | POA: Diagnosis not present

## 2016-06-30 DIAGNOSIS — M960 Pseudarthrosis after fusion or arthrodesis: Secondary | ICD-10-CM | POA: Diagnosis not present

## 2016-06-30 DIAGNOSIS — Z419 Encounter for procedure for purposes other than remedying health state, unspecified: Secondary | ICD-10-CM

## 2016-06-30 DIAGNOSIS — S129XXA Fracture of neck, unspecified, initial encounter: Secondary | ICD-10-CM | POA: Diagnosis present

## 2016-06-30 DIAGNOSIS — M50123 Cervical disc disorder at C6-C7 level with radiculopathy: Secondary | ICD-10-CM | POA: Diagnosis not present

## 2016-06-30 DIAGNOSIS — M4322 Fusion of spine, cervical region: Secondary | ICD-10-CM | POA: Diagnosis not present

## 2016-06-30 DIAGNOSIS — F329 Major depressive disorder, single episode, unspecified: Secondary | ICD-10-CM | POA: Diagnosis not present

## 2016-06-30 DIAGNOSIS — M5011 Cervical disc disorder with radiculopathy,  high cervical region: Secondary | ICD-10-CM | POA: Diagnosis not present

## 2016-06-30 DIAGNOSIS — Z981 Arthrodesis status: Secondary | ICD-10-CM | POA: Diagnosis not present

## 2016-06-30 DIAGNOSIS — M4712 Other spondylosis with myelopathy, cervical region: Secondary | ICD-10-CM

## 2016-06-30 DIAGNOSIS — M4722 Other spondylosis with radiculopathy, cervical region: Secondary | ICD-10-CM

## 2016-06-30 HISTORY — PX: ANTERIOR CERVICAL DECOMP/DISCECTOMY FUSION: SHX1161

## 2016-06-30 LAB — POCT I-STAT 4, (NA,K, GLUC, HGB,HCT)
GLUCOSE: 98 mg/dL (ref 65–99)
HEMATOCRIT: 36 % (ref 36.0–46.0)
HEMOGLOBIN: 12.2 g/dL (ref 12.0–15.0)
POTASSIUM: 2.9 mmol/L — AB (ref 3.5–5.1)
SODIUM: 135 mmol/L (ref 135–145)

## 2016-06-30 SURGERY — ANTERIOR CERVICAL DECOMPRESSION/DISCECTOMY FUSION 3 LEVEL/HARDWARE REMOVAL
Anesthesia: General

## 2016-06-30 MED ORDER — CHLORHEXIDINE GLUCONATE CLOTH 2 % EX PADS: 6.0000 | MEDICATED_PAD | Freq: Once | CUTANEOUS | Status: DC

## 2016-06-30 MED ORDER — ONDANSETRON HCL 4 MG/2ML IJ SOLN
INTRAMUSCULAR | Status: DC | PRN
  Administered 2016-06-30: 4 mg via INTRAVENOUS

## 2016-06-30 MED ORDER — PROPOFOL 10 MG/ML IV BOLUS
INTRAVENOUS | Status: AC
  Filled 2016-06-30: qty 20

## 2016-06-30 MED ORDER — FENTANYL CITRATE (PF) 100 MCG/2ML IJ SOLN
INTRAMUSCULAR | Status: AC
  Filled 2016-06-30: qty 4

## 2016-06-30 MED ORDER — LIDOCAINE HCL (CARDIAC) 20 MG/ML IV SOLN
INTRAVENOUS | Status: DC | PRN
  Administered 2016-06-30: 40 mg via INTRAVENOUS

## 2016-06-30 MED ORDER — HYDROCHLOROTHIAZIDE 25 MG PO TABS: 100.0000 mg | ORAL_TABLET | Freq: Every day | ORAL | Status: DC

## 2016-06-30 MED ORDER — FLEET ENEMA 7-19 GM/118ML RE ENEM: 1.0000 | ENEMA | Freq: Once | RECTAL | Status: DC | PRN

## 2016-06-30 MED ORDER — ONDANSETRON HCL 4 MG/2ML IJ SOLN: 4.0000 mg | INTRAMUSCULAR | Status: DC | PRN

## 2016-06-30 MED ORDER — DEXAMETHASONE SODIUM PHOSPHATE 10 MG/ML IJ SOLN
INTRAMUSCULAR | Status: AC
  Filled 2016-06-30: qty 1

## 2016-06-30 MED ORDER — KCL IN DEXTROSE-NACL 20-5-0.45 MEQ/L-%-% IV SOLN
INTRAVENOUS | Status: DC
Start: 2016-06-30 — End: 2016-07-01
  Administered 2016-06-30: 75 mL/h via INTRAVENOUS

## 2016-06-30 MED ORDER — THROMBIN 20000 UNITS EX SOLR
CUTANEOUS | Status: AC
  Filled 2016-06-30: qty 20000

## 2016-06-30 MED ORDER — CEFAZOLIN IN D5W 1 GM/50ML IV SOLN
1.0000 g | Freq: Three times a day (TID) | INTRAVENOUS | Status: AC
  Administered 2016-06-30 (×2): 1 g via INTRAVENOUS
  Filled 2016-06-30 (×2): qty 50

## 2016-06-30 MED ORDER — PROPOFOL 10 MG/ML IV BOLUS
INTRAVENOUS | Status: DC | PRN
  Administered 2016-06-30: 160 mg via INTRAVENOUS

## 2016-06-30 MED ORDER — MORPHINE SULFATE ER 30 MG PO TBCR
30.0000 mg | EXTENDED_RELEASE_TABLET | Freq: Two times a day (BID) | ORAL | Status: DC
  Administered 2016-06-30 – 2016-07-01 (×3): 30 mg via ORAL
  Filled 2016-06-30 (×3): qty 1

## 2016-06-30 MED ORDER — METHOCARBAMOL 500 MG PO TABS
500.0000 mg | ORAL_TABLET | Freq: Four times a day (QID) | ORAL | Status: DC | PRN
  Administered 2016-06-30 – 2016-07-01 (×4): 500 mg via ORAL
  Filled 2016-06-30 (×3): qty 1

## 2016-06-30 MED ORDER — OXYCODONE HCL 5 MG/5ML PO SOLN: 5.0000 mg | Freq: Once | ORAL | Status: AC | PRN

## 2016-06-30 MED ORDER — BUPROPION HCL ER (XL) 300 MG PO TB24
300.0000 mg | ORAL_TABLET | Freq: Every day | ORAL | Status: DC
  Administered 2016-07-01: 300 mg via ORAL
  Filled 2016-06-30: qty 1

## 2016-06-30 MED ORDER — SENNA 8.6 MG PO TABS
1.0000 | ORAL_TABLET | Freq: Every day | ORAL | Status: DC
  Administered 2016-07-01: 8.6 mg via ORAL
  Filled 2016-06-30: qty 1

## 2016-06-30 MED ORDER — METHOCARBAMOL 500 MG PO TABS
ORAL_TABLET | ORAL | Status: AC
  Administered 2016-06-30: 500 mg via ORAL
  Filled 2016-06-30: qty 1

## 2016-06-30 MED ORDER — HYDROCODONE-ACETAMINOPHEN 5-325 MG PO TABS: 1.0000 | ORAL_TABLET | ORAL | Status: DC | PRN

## 2016-06-30 MED ORDER — ROCURONIUM BROMIDE 100 MG/10ML IV SOLN
INTRAVENOUS | Status: DC | PRN
  Administered 2016-06-30: 10 mg via INTRAVENOUS
  Administered 2016-06-30: 20 mg via INTRAVENOUS
  Administered 2016-06-30: 10 mg via INTRAVENOUS
  Administered 2016-06-30: 50 mg via INTRAVENOUS
  Administered 2016-06-30: 10 mg via INTRAVENOUS

## 2016-06-30 MED ORDER — PANTOPRAZOLE SODIUM 40 MG IV SOLR: 40.0000 mg | Freq: Every day | INTRAVENOUS | Status: DC

## 2016-06-30 MED ORDER — BUPIVACAINE HCL (PF) 0.5 % IJ SOLN
INTRAMUSCULAR | Status: DC | PRN
  Administered 2016-06-30: 4 mL

## 2016-06-30 MED ORDER — PROPOFOL 10 MG/ML IV BOLUS
INTRAVENOUS | Status: AC
Start: 2016-06-30 — End: 2016-06-30
  Filled 2016-06-30: qty 20

## 2016-06-30 MED ORDER — GELATIN ABSORBABLE MT POWD
OROMUCOSAL | Status: DC | PRN
  Administered 2016-06-30: 09:00:00 via TOPICAL

## 2016-06-30 MED ORDER — OXYCODONE HCL 10 MG PO TABS: 10.0000 mg | ORAL_TABLET | ORAL | Status: DC | PRN

## 2016-06-30 MED ORDER — ACETAMINOPHEN 325 MG PO TABS: 650.0000 mg | ORAL_TABLET | ORAL | Status: DC | PRN

## 2016-06-30 MED ORDER — ACETAMINOPHEN 650 MG RE SUPP: 650.0000 mg | RECTAL | Status: DC | PRN

## 2016-06-30 MED ORDER — ZOLPIDEM TARTRATE 5 MG PO TABS: 5.0000 mg | ORAL_TABLET | Freq: Every evening | ORAL | Status: DC | PRN

## 2016-06-30 MED ORDER — MENTHOL 3 MG MT LOZG: 1.0000 | LOZENGE | OROMUCOSAL | Status: DC | PRN

## 2016-06-30 MED ORDER — SUGAMMADEX SODIUM 200 MG/2ML IV SOLN
INTRAVENOUS | Status: AC
  Filled 2016-06-30: qty 2

## 2016-06-30 MED ORDER — SODIUM CHLORIDE 0.9% FLUSH: 3.0000 mL | INTRAVENOUS | Status: DC | PRN

## 2016-06-30 MED ORDER — PHENYLEPHRINE HCL 10 MG/ML IJ SOLN
INTRAMUSCULAR | Status: DC | PRN
  Administered 2016-06-30 (×3): 80 ug via INTRAVENOUS
  Administered 2016-06-30: 120 ug via INTRAVENOUS

## 2016-06-30 MED ORDER — BISACODYL 10 MG RE SUPP: 10.0000 mg | Freq: Every day | RECTAL | Status: DC | PRN

## 2016-06-30 MED ORDER — FENTANYL CITRATE (PF) 100 MCG/2ML IJ SOLN
25.0000 ug | INTRAMUSCULAR | Status: DC | PRN
  Administered 2016-06-30 (×2): 50 ug via INTRAVENOUS

## 2016-06-30 MED ORDER — HYDROMORPHONE HCL 1 MG/ML IJ SOLN
0.5000 mg | INTRAMUSCULAR | Status: DC | PRN
  Administered 2016-06-30: 1 mg via INTRAVENOUS
  Filled 2016-06-30: qty 1

## 2016-06-30 MED ORDER — OXYCODONE HCL 5 MG PO TABS
5.0000 mg | ORAL_TABLET | Freq: Once | ORAL | Status: AC | PRN
  Administered 2016-06-30: 5 mg via ORAL

## 2016-06-30 MED ORDER — FENTANYL CITRATE (PF) 100 MCG/2ML IJ SOLN
INTRAMUSCULAR | Status: DC | PRN
  Administered 2016-06-30: 25 ug via INTRAVENOUS
  Administered 2016-06-30 (×3): 50 ug via INTRAVENOUS
  Administered 2016-06-30: 25 ug via INTRAVENOUS

## 2016-06-30 MED ORDER — PANTOPRAZOLE SODIUM 40 MG PO TBEC
40.0000 mg | DELAYED_RELEASE_TABLET | Freq: Every day | ORAL | Status: DC
  Administered 2016-06-30: 40 mg via ORAL
  Filled 2016-06-30: qty 1

## 2016-06-30 MED ORDER — THYROID 60 MG PO TABS
90.0000 mg | ORAL_TABLET | Freq: Every day | ORAL | Status: DC
  Filled 2016-06-30: qty 1

## 2016-06-30 MED ORDER — DEXAMETHASONE SODIUM PHOSPHATE 10 MG/ML IJ SOLN
INTRAMUSCULAR | Status: DC | PRN
  Administered 2016-06-30: 10 mg via INTRAVENOUS

## 2016-06-30 MED ORDER — PHENOL 1.4 % MT LIQD
1.0000 | OROMUCOSAL | Status: DC | PRN
Start: 2016-06-30 — End: 2016-07-01

## 2016-06-30 MED ORDER — HYDROCHLOROTHIAZIDE 50 MG PO TABS
100.0000 mg | ORAL_TABLET | Freq: Every day | ORAL | Status: DC
  Administered 2016-06-30: 100 mg via ORAL
  Filled 2016-06-30 (×2): qty 2

## 2016-06-30 MED ORDER — SODIUM CHLORIDE 0.9% FLUSH
3.0000 mL | Freq: Two times a day (BID) | INTRAVENOUS | Status: DC
  Administered 2016-06-30: 3 mL via INTRAVENOUS

## 2016-06-30 MED ORDER — THROMBIN 20000 UNITS EX SOLR
CUTANEOUS | Status: DC | PRN
  Administered 2016-06-30: 09:00:00 via TOPICAL

## 2016-06-30 MED ORDER — FENTANYL CITRATE (PF) 100 MCG/2ML IJ SOLN
INTRAMUSCULAR | Status: AC
  Filled 2016-06-30: qty 2

## 2016-06-30 MED ORDER — ONDANSETRON HCL 4 MG/2ML IJ SOLN: 4.0000 mg | Freq: Once | INTRAMUSCULAR | Status: DC | PRN

## 2016-06-30 MED ORDER — MIDAZOLAM HCL 5 MG/5ML IJ SOLN
INTRAMUSCULAR | Status: DC | PRN
Start: 2016-06-30 — End: 2016-06-30
  Administered 2016-06-30: 2 mg via INTRAVENOUS

## 2016-06-30 MED ORDER — ONDANSETRON HCL 4 MG/2ML IJ SOLN
INTRAMUSCULAR | Status: AC
  Filled 2016-06-30: qty 2

## 2016-06-30 MED ORDER — PHENYLEPHRINE HCL 10 MG/ML IJ SOLN
INTRAMUSCULAR | Status: DC | PRN
  Administered 2016-06-30: 25 ug/min via INTRAVENOUS

## 2016-06-30 MED ORDER — FENTANYL CITRATE (PF) 100 MCG/2ML IJ SOLN
INTRAMUSCULAR | Status: AC
  Administered 2016-06-30: 50 ug via INTRAVENOUS
  Filled 2016-06-30: qty 2

## 2016-06-30 MED ORDER — ALPRAZOLAM 0.5 MG PO TABS
0.5000 mg | ORAL_TABLET | Freq: Every day | ORAL | Status: DC
  Administered 2016-06-30: 0.5 mg via ORAL
  Filled 2016-06-30: qty 1

## 2016-06-30 MED ORDER — LACTATED RINGERS IV SOLN
INTRAVENOUS | Status: DC | PRN
  Administered 2016-06-30 (×2): via INTRAVENOUS

## 2016-06-30 MED ORDER — MIDAZOLAM HCL 2 MG/2ML IJ SOLN
INTRAMUSCULAR | Status: AC
  Filled 2016-06-30: qty 2

## 2016-06-30 MED ORDER — SUGAMMADEX SODIUM 200 MG/2ML IV SOLN
INTRAVENOUS | Status: DC | PRN
  Administered 2016-06-30: 200 mg via INTRAVENOUS

## 2016-06-30 MED ORDER — ALUM & MAG HYDROXIDE-SIMETH 200-200-20 MG/5ML PO SUSP: 30.0000 mL | Freq: Four times a day (QID) | ORAL | Status: DC | PRN

## 2016-06-30 MED ORDER — LIDOCAINE-EPINEPHRINE (PF) 2 %-1:200000 IJ SOLN
INTRAMUSCULAR | Status: DC | PRN
  Administered 2016-06-30: 4 mL via INTRADERMAL

## 2016-06-30 MED ORDER — METHOCARBAMOL 1000 MG/10ML IJ SOLN
500.0000 mg | Freq: Four times a day (QID) | INTRAVENOUS | Status: DC | PRN
  Filled 2016-06-30: qty 5

## 2016-06-30 MED ORDER — KCL IN DEXTROSE-NACL 20-5-0.45 MEQ/L-%-% IV SOLN
INTRAVENOUS | Status: AC
  Administered 2016-06-30: 75 mL/h via INTRAVENOUS
  Filled 2016-06-30: qty 1000

## 2016-06-30 MED ORDER — DOCUSATE SODIUM 100 MG PO CAPS
100.0000 mg | ORAL_CAPSULE | Freq: Two times a day (BID) | ORAL | Status: DC
  Administered 2016-06-30 – 2016-07-01 (×2): 100 mg via ORAL
  Filled 2016-06-30 (×2): qty 1

## 2016-06-30 MED ORDER — THROMBIN 5000 UNITS EX SOLR
CUTANEOUS | Status: AC
  Filled 2016-06-30: qty 5000

## 2016-06-30 MED ORDER — LIDOCAINE-EPINEPHRINE (PF) 2 %-1:200000 IJ SOLN
INTRAMUSCULAR | Status: AC
  Filled 2016-06-30: qty 20

## 2016-06-30 MED ORDER — GLYCOPYRROLATE 0.2 MG/ML IV SOSY
PREFILLED_SYRINGE | INTRAVENOUS | Status: AC
  Filled 2016-06-30: qty 3

## 2016-06-30 MED ORDER — 0.9 % SODIUM CHLORIDE (POUR BTL) OPTIME
TOPICAL | Status: DC | PRN
  Administered 2016-06-30: 1000 mL

## 2016-06-30 MED ORDER — CEFAZOLIN SODIUM-DEXTROSE 2-4 GM/100ML-% IV SOLN
2.0000 g | INTRAVENOUS | Status: AC
  Administered 2016-06-30: 2 g via INTRAVENOUS
  Filled 2016-06-30: qty 100

## 2016-06-30 MED ORDER — OXYCODONE-ACETAMINOPHEN 5-325 MG PO TABS
1.0000 | ORAL_TABLET | ORAL | Status: DC | PRN
  Administered 2016-06-30 – 2016-07-01 (×5): 2 via ORAL
  Filled 2016-06-30 (×5): qty 2

## 2016-06-30 MED ORDER — OXYCODONE HCL 5 MG PO TABS
ORAL_TABLET | ORAL | Status: AC
  Administered 2016-06-30: 5 mg via ORAL
  Filled 2016-06-30: qty 1

## 2016-06-30 MED ORDER — SENNOSIDES-DOCUSATE SODIUM 8.6-50 MG PO TABS: 1.0000 | ORAL_TABLET | Freq: Every evening | ORAL | Status: DC | PRN

## 2016-06-30 SURGICAL SUPPLY — 79 items
ADH SKN CLS APL DERMABOND .7 (GAUZE/BANDAGES/DRESSINGS) ×1
ALLOGRAFT CA 6X14X11 (Bone Implant) ×1 IMPLANT
BASKET BONE COLLECTION (BASKET) ×1 IMPLANT
BIT DRILL 14X2.5XNS TI ANT (BIT) IMPLANT
BIT DRILL AVIATOR 14 (BIT) ×2
BIT DRILL NEURO 2X3.1 SFT TUCH (MISCELLANEOUS) ×1 IMPLANT
BIT DRL 14X2.5XNS TI ANT (BIT) ×1
BLADE 15 SAFETY STRL DISP (BLADE) ×1 IMPLANT
BLADE ULTRA TIP 2M (BLADE) IMPLANT
BNDG GAUZE ELAST 4 BULKY (GAUZE/BANDAGES/DRESSINGS) IMPLANT
BUR BARREL STRAIGHT FLUTE 4.0 (BURR) ×2 IMPLANT
CANISTER SUCT 3000ML PPV (MISCELLANEOUS) ×2 IMPLANT
CARTRIDGE OIL MAESTRO DRILL (MISCELLANEOUS) ×1 IMPLANT
COVER MAYO STAND STRL (DRAPES) ×2 IMPLANT
DECANTER SPIKE VIAL GLASS SM (MISCELLANEOUS) ×2 IMPLANT
DERMABOND ADVANCED (GAUZE/BANDAGES/DRESSINGS) ×1
DERMABOND ADVANCED .7 DNX12 (GAUZE/BANDAGES/DRESSINGS) ×1 IMPLANT
DIFFUSER DRILL AIR PNEUMATIC (MISCELLANEOUS) ×2 IMPLANT
DRAIN JACKSON PRATT 10MM FLAT (MISCELLANEOUS) ×1 IMPLANT
DRAPE HALF SHEET 40X57 (DRAPES) IMPLANT
DRAPE LAPAROTOMY 100X72 PEDS (DRAPES) ×2 IMPLANT
DRAPE MICROSCOPE LEICA (MISCELLANEOUS) ×2 IMPLANT
DRAPE POUCH INSTRU U-SHP 10X18 (DRAPES) ×2 IMPLANT
DRILL NEURO 2X3.1 SOFT TOUCH (MISCELLANEOUS) ×2
DRSG OPSITE POSTOP 3X4 (GAUZE/BANDAGES/DRESSINGS) ×1 IMPLANT
DURAPREP 6ML APPLICATOR 50/CS (WOUND CARE) ×2 IMPLANT
ELECT COATED BLADE 2.86 ST (ELECTRODE) ×2 IMPLANT
ELECT REM PT RETURN 9FT ADLT (ELECTROSURGICAL) ×2
ELECTRODE REM PT RTRN 9FT ADLT (ELECTROSURGICAL) ×1 IMPLANT
EVACUATOR SILICONE 100CC (DRAIN) ×2 IMPLANT
GAUZE SPONGE 4X4 12PLY STRL (GAUZE/BANDAGES/DRESSINGS) IMPLANT
GAUZE SPONGE 4X4 16PLY XRAY LF (GAUZE/BANDAGES/DRESSINGS) IMPLANT
GLOVE BIO SURGEON STRL SZ8 (GLOVE) ×3 IMPLANT
GLOVE BIOGEL PI IND STRL 8 (GLOVE) ×1 IMPLANT
GLOVE BIOGEL PI IND STRL 8.5 (GLOVE) ×1 IMPLANT
GLOVE BIOGEL PI INDICATOR 8 (GLOVE) ×1
GLOVE BIOGEL PI INDICATOR 8.5 (GLOVE) ×1
GLOVE ECLIPSE 7.5 STRL STRAW (GLOVE) ×2 IMPLANT
GLOVE EXAM NITRILE LRG STRL (GLOVE) IMPLANT
GLOVE EXAM NITRILE XL STR (GLOVE) IMPLANT
GLOVE EXAM NITRILE XS STR PU (GLOVE) IMPLANT
GLOVE INDICATOR 8.5 STRL (GLOVE) ×1 IMPLANT
GOWN STRL REUS W/ TWL LRG LVL3 (GOWN DISPOSABLE) IMPLANT
GOWN STRL REUS W/ TWL XL LVL3 (GOWN DISPOSABLE) ×2 IMPLANT
GOWN STRL REUS W/TWL 2XL LVL3 (GOWN DISPOSABLE) IMPLANT
GOWN STRL REUS W/TWL LRG LVL3 (GOWN DISPOSABLE)
GOWN STRL REUS W/TWL XL LVL3 (GOWN DISPOSABLE) ×4
HALTER HD/CHIN CERV TRACTION D (MISCELLANEOUS) ×2 IMPLANT
HEMOSTAT POWDER KIT SURGIFOAM (HEMOSTASIS) ×1 IMPLANT
KIT BASIN OR (CUSTOM PROCEDURE TRAY) ×2 IMPLANT
KIT ROOM TURNOVER OR (KITS) ×2 IMPLANT
NDL HYPO 18GX1.5 BLUNT FILL (NEEDLE) ×1 IMPLANT
NDL HYPO 25X1 1.5 SAFETY (NEEDLE) ×1 IMPLANT
NDL SPNL 22GX3.5 QUINCKE BK (NEEDLE) ×1 IMPLANT
NEEDLE HYPO 18GX1.5 BLUNT FILL (NEEDLE) ×2 IMPLANT
NEEDLE HYPO 25X1 1.5 SAFETY (NEEDLE) ×2 IMPLANT
NEEDLE SPNL 22GX3.5 QUINCKE BK (NEEDLE) ×2 IMPLANT
NS IRRIG 1000ML POUR BTL (IV SOLUTION) ×2 IMPLANT
OIL CARTRIDGE MAESTRO DRILL (MISCELLANEOUS) ×2
PACK LAMINECTOMY NEURO (CUSTOM PROCEDURE TRAY) ×2 IMPLANT
PAD ARMBOARD 7.5X6 YLW CONV (MISCELLANEOUS) ×6 IMPLANT
PEEK SPACER AVS AS 6X14X16 4D (Cage) ×2 IMPLANT
PIN DISTRACTION 14MM (PIN) ×4 IMPLANT
PLATE AVIATOR ASSY 1LVL SZ 18 (Plate) ×2 IMPLANT
PLATE AVIATOR ASSY 2LVL SZ 28 (Plate) ×1 IMPLANT
PUTTY DBX 1CC (Putty) ×2 IMPLANT
PUTTY DBX 1CC DEPUY (Putty) IMPLANT
RUBBERBAND STERILE (MISCELLANEOUS) ×4 IMPLANT
SCREW AVIAT VAR SLFTAP 4.35X14 (Screw) ×12 IMPLANT
SCREW AVIATOR VAR SELFTAP 4X14 (Screw) ×4 IMPLANT
SPONGE INTESTINAL PEANUT (DISPOSABLE) ×6 IMPLANT
SPONGE SURGIFOAM ABS GEL 100 (HEMOSTASIS) ×1 IMPLANT
STAPLER SKIN PROX WIDE 3.9 (STAPLE) IMPLANT
SUT VIC AB 3-0 SH 8-18 (SUTURE) ×2 IMPLANT
SYR 3ML LL SCALE MARK (SYRINGE) ×2 IMPLANT
TOWEL OR 17X24 6PK STRL BLUE (TOWEL DISPOSABLE) ×2 IMPLANT
TOWEL OR 17X26 10 PK STRL BLUE (TOWEL DISPOSABLE) ×2 IMPLANT
TRAY FOLEY W/METER SILVER 16FR (SET/KITS/TRAYS/PACK) IMPLANT
WATER STERILE IRR 1000ML POUR (IV SOLUTION) ×2 IMPLANT

## 2016-06-30 NOTE — Evaluation (Signed)
Physical Therapy Evaluation Patient Details Name: Rose Boyer MRN: [id number] DOB: October 20, 1955 Today's Date: 06/30/2016   History of Present Illness  Pt is a 60 y/o female s/p C3-C4 and C6-C7 ACDF with exploration and redo of C5-C6 ACDF. 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  Clinical Impression  Pt presented supine in bed with HOB elevated, awake and willing to participate in therapy session. Prior to admission, pt reported that she was independent with all functional mobility. Pt moving well during evaluation with supervision. No further acute PT needs at this time. PT signing off.     Follow Up Recommendations No PT follow up    Equipment Recommendations  None recommended by PT    Recommendations for Other Services       Precautions / Restrictions Precautions Precautions: Cervical Precaution Comments: PT gave pt handout and reviewed with pt Required Braces or Orthoses: Cervical Brace Cervical Brace: Hard collar Restrictions Weight Bearing Restrictions: No      Mobility  Bed Mobility Overal bed mobility: Modified Independent             General bed mobility comments: pt performed log roll technique with increased time and use of bed rails  Transfers Overall transfer level: Needs assistance Equipment used: None Transfers: Sit to/from Stand Sit to Stand: Supervision         General transfer comment: supervision for safety, no instability noted  Ambulation/Gait Ambulation/Gait assistance: Supervision Ambulation Distance (Feet): 300 Feet Assistive device: None Gait Pattern/deviations: Step-through pattern;Decreased stride length Gait velocity: WFL   General Gait Details: pt demonstrated safety during ambulation with no AD. pt with mild instability but able to self correct, no physical assist from therapist needed  Stairs Stairs: Yes   Stair Management: One rail  Right;Alternating pattern;Forwards Number of Stairs: 12 General stair comments: pt demonstrated safety with ascending and descending stairs, no LOB  Wheelchair Mobility    Modified Rankin (Stroke Patients Only)       Balance Overall balance assessment: Needs assistance Sitting-balance support: Feet supported;No upper extremity supported Sitting balance-Leahy Scale: Good     Standing balance support: During functional activity;No upper extremity supported Standing balance-Leahy Scale: Fair                               Pertinent Vitals/Pain Pain Assessment: Faces Faces Pain Scale: Hurts a little bit Pain Location: bilateral shoulders, base of posterior neck, incision site Pain Descriptors / Indicators: Sore Pain Intervention(s): Monitored during session;Repositioned    Home Living Family/patient expects to be discharged to:: Private residence Living Arrangements: Parent Available Help at Discharge: Family;Available 24 hours/day Type of Home: House Home Access: Stairs to enter Entrance Stairs-Rails: Right Entrance Stairs-Number of Steps: 7 Home Layout: Two level Home Equipment: None      Prior Function Level of Independence: Independent               Hand Dominance        Extremity/Trunk Assessment   Upper Extremity Assessment: Overall WFL for tasks assessed           Lower Extremity Assessment: Overall WFL for tasks assessed      Cervical / Trunk Assessment: Other exceptions  Communication   Communication: No difficulties  Cognition Arousal/Alertness: Awake/alert Behavior During Therapy: WFL for tasks assessed/performed Overall Cognitive Status: Within Functional Limits for tasks assessed  General Comments      Exercises     Assessment/Plan    PT Assessment Patent does not need any further PT services  PT Problem List            PT Treatment Interventions      PT Goals (Current goals can  be found in the Care Plan section)  Acute Rehab PT Goals Patient Stated Goal: return home    Frequency     Barriers to discharge        Co-evaluation               End of Session Equipment Utilized During Treatment: Cervical collar;Gait belt Activity Tolerance: Patient tolerated treatment well Patient left: in chair;with call bell/phone within reach;with family/visitor present Nurse Communication: Mobility status         Time: [id number] PT Time Calculation (min) (ACUTE ONLY): 14 min   Charges:   PT Evaluation $PT Eval Low Complexity: 1 Procedure     PT G CodesClearnce Sorrel Kathryne Ramella 06/30/2016, 5:13 PM Sherie Don, [place], DPT [phone]

## 2016-06-30 NOTE — Op Note (Signed)
06/30/2016  11:06 AM  PATIENT:  Rose Boyer  60 y.o. 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 with neck pain and cervical radiculopathy  PROCEDURE:  Procedure(s) with comments: EXPLORATION OF CERVICAL FOUR-SIX FUSION/REMOVAL OF NUVASIVE HARDWARE/REDO ANTERIOR CERVICAL DECOMPRESSION/DISKECTOMY FUSION AT CERVICAL FIVE- SIX WITH CERVICAL THREE- FOUR, CERVICAL SIX - SEVEN ANTERIOR CERVICAL DECOMPRESSION/DISKECTOMY/FUSION, PLATING CERVICAL THREE-CERVICAL FOUR AND CERVICAL FIVE- CERVICAL SEVEN (N/A) - EXPLORATION OF C4-6 FUSION/REMOVAL OF NUVASIVE HARDWARE/REDO ANTERIOR CERVICAL DECOMPRESSION/DISKECTOMY FUSION AT C5-6 WITH C3-4, C6-7 ANTERIOR CERVICAL DECOMPRESSION/DISKECTOMY/FUSION with plating C 3 - C 4 and C 5 - C 7 levels  SURGEON:  Surgeon(s) and Role:    * Erline Levine, MD - Primary    * Kary Kos, MD - Assisting  PHYSICIAN ASSISTANT:   ASSISTANTS: Poteat, RN   ANESTHESIA:   general  EBL:  Total I/O In: -  Out: 125 [Blood:125]  BLOOD ADMINISTERED:none  DRAINS: (10) Jackson-Pratt drain(s) with closed bulb suction in the prevertebral space   LOCAL MEDICATIONS USED:  LIDOCAINE   SPECIMEN:  No Specimen  DISPOSITION OF SPECIMEN:  N/A  COUNTS:  YES  TOURNIQUET:  * No tourniquets in log *  DICTATION: DICTATION: Patient was brought to operating room and following the smooth and uncomplicated induction of general endotracheal anesthesia her head was placed on a horseshoe head holder he was placed in 5 pounds of Holter traction and his anterior neck was prepped and draped in usual sterile fashion. Previous incision was reopened on the left side of midline after infiltrating the skin and subcutaneous tissues with local lidocaine. The platysmal layer was incised and subplatysmal dissection was  performed exposing the anterior border sternocleidomastoid muscle. Using blunt dissection the carotid sheath was kept lateral and trachea and esophagus kept medial exposing the anterior cervical spine. There was dense scar tissue and carotid sheath was scarred to the previously placed plate.  Using careful, painstaking dissection, the carotid and Jugular veins were mobilized and the previously placed plate was exposed, cleared of investing scar tissue.  The plate was removed. Longus coli muscles were taken down from the anterior cervical spine using electrocautery and key elevator and self-retaining retractor was placed. The C 45 level appeared to be well fused, but there was clear evidence of pseudoarthrosis at the C 56 level.  The interspace at C 34 was incised and a thorough discectomy was performed. Distraction pins were placed. Uncinate spurs and central spondylitic ridges were drilled down with a high-speed drill. The spinal cord dura and both C4 nerve roots were widely decompressed. A large compressive spur was removed on the right.  Hemostasis was assured. After trial sizing a 6 mm peek interbody cage was selected and packed with local autograft and DBM. The graft was tamped into position and countersunk appropriately. An 18 mm Aviator placed was placed at the C 34 level with 4.5 mm screws at C 4 and 4 mm screws at C 3, all 14 mm in length.  The retractor was moved and the interspace at C5-6 was drilled out and pseudoarthrosis was thoroughly decompressed to bleeding bone.   Distraction pins were placed. Uncinate spurs and central spondylitic ridges were drilled down with a high-speed drill. The spinal cord dura and both C6 nerve roots were widely decompressed. Hemostasis was assured. After trial sizing a 6 mm  Allograft bone wedge was selected and tamped into position and countersunk appropriately.The interspace at C 67 was incised and a thorough discectomy was performed. Distraction pins were placed.  Uncinate spurs and central spondylitic ridges were drilled down with a high-speed drill. The spinal cord dura and both C7 nerve roots were widely decompressed. Hemostasis was assured. After trial sizing a 6 mm peek interbody cage was selected and packed with autograft and DBM. The graft was tamped into position and countersunk appropriately.  Distraction weight was removed. A 28 mm Aviator anterior cervical plate was affixed to the cervical spine with 14 mm variable-angle screws 2 at C5, 2 at C6, 2 at C7,  (4.5 x 14 at C 5 and C 6 levels and 4 x 14 mm at C 7). All screws were well-positioned and locking mechanisms were engaged. Soft tissues were inspected and found to be in good repair. The wound was irrigated. A final x-ray was obtained with good visualization at C 3- C 4 and the top of the construct at C 5  with the uppermost  interbody graft well visualized. Hemostasis was assured and a # 10 JP drain was placed through a separate stab incision. The platysma layer was closed with 3-0 Vicryl stitches and the skin was reapproximated with 3-0 Vicryl subcuticular stitches. The wound was dressed with Dermabond. Counts were correct at the end of the case. Patient was extubated and taken to recovery in stable and satisfactory condition.    PLAN OF CARE: Admit to inpatient   PATIENT DISPOSITION:  PACU - hemodynamically stable.   Delay start of Pharmacological VTE agent (>24hrs) due to surgical blood loss or risk of bleeding: yes

## 2016-06-30 NOTE — Brief Op Note (Signed)
06/30/2016  11:06 AM  PATIENT:  Rose Boyer  60 y.o. 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 with neck pain and cervical radiculopathy  PROCEDURE:  Procedure(s) with comments: EXPLORATION OF CERVICAL FOUR-SIX FUSION/REMOVAL OF NUVASIVE HARDWARE/REDO ANTERIOR CERVICAL DECOMPRESSION/DISKECTOMY FUSION AT CERVICAL FIVE- SIX WITH CERVICAL THREE- FOUR, CERVICAL SIX - SEVEN ANTERIOR CERVICAL DECOMPRESSION/DISKECTOMY/FUSION, PLATING CERVICAL THREE-CERVICAL FOUR AND CERVICAL FIVE- CERVICAL SEVEN (N/A) - EXPLORATION OF C4-6 FUSION/REMOVAL OF NUVASIVE HARDWARE/REDO ANTERIOR CERVICAL DECOMPRESSION/DISKECTOMY FUSION AT C5-6 WITH C3-4, C6-7 ANTERIOR CERVICAL DECOMPRESSION/DISKECTOMY/FUSION with plating C 3 - C 4 and C 5 - C 7 levels  SURGEON:  Surgeon(s) and Role:    * Erline Levine, MD - Primary    * Kary Kos, MD - Assisting  PHYSICIAN ASSISTANT:   ASSISTANTS: Poteat, RN   ANESTHESIA:   general  EBL:  Total I/O In: -  Out: 125 [Blood:125]  BLOOD ADMINISTERED:none  DRAINS: (10) Jackson-Pratt drain(s) with closed bulb suction in the prevertebral space   LOCAL MEDICATIONS USED:  LIDOCAINE   SPECIMEN:  No Specimen  DISPOSITION OF SPECIMEN:  N/A  COUNTS:  YES  TOURNIQUET:  * No tourniquets in log *  DICTATION: DICTATION: Patient was brought to operating room and following the smooth and uncomplicated induction of general endotracheal anesthesia her head was placed on a horseshoe head holder he was placed in 5 pounds of Holter traction and his anterior neck was prepped and draped in usual sterile fashion. Previous incision was reopened on the left side of midline after infiltrating the skin and subcutaneous tissues with local lidocaine. The platysmal layer was incised and subplatysmal dissection was  performed exposing the anterior border sternocleidomastoid muscle. Using blunt dissection the carotid sheath was kept lateral and trachea and esophagus kept medial exposing the anterior cervical spine. There was dense scar tissue and carotid sheath was scarred to the previously placed plate.  Using careful, painstaking dissection, the carotid and Jugular veins were mobilized and the previously placed plate was exposed, cleared of investing scar tissue.  The plate was removed. Longus coli muscles were taken down from the anterior cervical spine using electrocautery and key elevator and self-retaining retractor was placed. The C 45 level appeared to be well fused, but there was clear evidence of pseudoarthrosis at the C 56 level.  The interspace at C 34 was incised and a thorough discectomy was performed. Distraction pins were placed. Uncinate spurs and central spondylitic ridges were drilled down with a high-speed drill. The spinal cord dura and both C4 nerve roots were widely decompressed. A large compressive spur was removed on the right.  Hemostasis was assured. After trial sizing a 6 mm peek interbody cage was selected and packed with local autograft and DBM. The graft was tamped into position and countersunk appropriately. An 18 mm Aviator placed was placed at the C 34 level with 4.5 mm screws at C 4 and 4 mm screws at C 3, all 14 mm in length.  The retractor was moved and the interspace at C5-6 was drilled out and pseudoarthrosis was thoroughly decompressed to bleeding bone.   Distraction pins were placed. Uncinate spurs and central spondylitic ridges were drilled down with a high-speed drill. The spinal cord dura and both C6 nerve roots were widely decompressed. Hemostasis was assured. After trial sizing a 6 mm  Allograft bone wedge was selected and tamped into position and countersunk appropriately.The interspace at C 67 was incised and a thorough discectomy was performed. Distraction pins were placed.  Uncinate spurs and central spondylitic ridges were drilled down with a high-speed drill. The spinal cord dura and both C7 nerve roots were widely decompressed. Hemostasis was assured. After trial sizing a 6 mm peek interbody cage was selected and packed with autograft and DBM. The graft was tamped into position and countersunk appropriately.  Distraction weight was removed. A 28 mm Aviator anterior cervical plate was affixed to the cervical spine with 14 mm variable-angle screws 2 at C5, 2 at C6, 2 at C7,  (4.5 x 14 at C 5 and C 6 levels and 4 x 14 mm at C 7). All screws were well-positioned and locking mechanisms were engaged. Soft tissues were inspected and found to be in good repair. The wound was irrigated. A final x-ray was obtained with good visualization at C 3- C 4 and the top of the construct at C 5  with the uppermost  interbody graft well visualized. Hemostasis was assured and a # 10 JP drain was placed through a separate stab incision. The platysma layer was closed with 3-0 Vicryl stitches and the skin was reapproximated with 3-0 Vicryl subcuticular stitches. The wound was dressed with Dermabond. Counts were correct at the end of the case. Patient was extubated and taken to recovery in stable and satisfactory condition.    PLAN OF CARE: Admit to inpatient   PATIENT DISPOSITION:  PACU - hemodynamically stable.   Delay start of Pharmacological VTE agent (>24hrs) due to surgical blood loss or risk of bleeding: yes

## 2016-06-30 NOTE — Anesthesia Postprocedure Evaluation (Signed)
Anesthesia Post Note  Patient: Rose Boyer  Procedure(s) Performed: Procedure(s) (LRB): EXPLORATION OF CERVICAL FOUR-SIX FUSION/REMOVAL OF NUVASIVE HARDWARE/REDO ANTERIOR CERVICAL DECOMPRESSION/DISKECTOMY FUSION AT CERVICAL FIVE- SIX WITH CERVICAL THREE- FOUR, CERVICAL SIX - SEVEN ANTERIOR CERVICAL DECOMPRESSION/DISKECTOMY/FUSION, PLATING CERVICAL THREE-CERVICAL FOUR AND CERVICAL FIVE- CERVICAL SEVEN (N/A)  Patient location during evaluation: PACU Anesthesia Type: General Level of consciousness: awake, awake and alert and oriented Pain management: pain level controlled Vital Signs Assessment: post-procedure vital signs reviewed and stable Respiratory status: spontaneous breathing, nonlabored ventilation and respiratory function stable Cardiovascular status: blood pressure returned to baseline Anesthetic complications: no    Last Vitals:  Vitals:   06/30/16 1235 06/30/16 1300  BP: 139/79 135/80  Pulse: 92 92  Resp: 13 16  Temp:  37.1 C    Last Pain:  Vitals:   06/30/16 1529  TempSrc:   PainSc: 7                  Donnah Levert COKER

## 2016-06-30 NOTE — Transfer of Care (Signed)
Immediate Anesthesia Transfer of Care Note  Patient: Rose Boyer  Procedure(s) Performed: Procedure(s) with comments: EXPLORATION OF CERVICAL FOUR-SIX FUSION/REMOVAL OF NUVASIVE HARDWARE/REDO ANTERIOR CERVICAL DECOMPRESSION/DISKECTOMY FUSION AT CERVICAL FIVE- SIX WITH CERVICAL THREE- FOUR, CERVICAL SIX - SEVEN ANTERIOR CERVICAL DECOMPRESSION/DISKECTOMY/FUSION, PLATING CERVICAL THREE-CERVICAL FOUR AND CERVICAL FIVE- CERVICAL SEVEN (N/A) - EXPLORATION OF C4-6 FUSION/REMOVAL OF NUVASIVE HARDWARE/REDO ANTERIOR CERVICAL DECOMPRESSION/DISKECTOMY FUSION AT C5-6 WITH C3-4, C6-7 ANTERIOR CERVICAL DECOMPRESSION/DISKECTOMY/FUSION  Patient Location: PACU  Anesthesia Type:General  Level of Consciousness: awake, alert  and oriented  Airway & Oxygen Therapy: Patient Spontanous Breathing and Patient connected to nasal cannula oxygen  Post-op Assessment: Report given to RN and Post -op Vital signs reviewed and stable  Post vital signs: Reviewed and stable  Last Vitals:  Vitals:   06/30/16 0616  BP: 125/78  Pulse: 77  Resp: 20  Temp: 36.8 C    Last Pain:  Vitals:   06/30/16 1120  TempSrc:   PainSc: (P) 8       Patients Stated Pain Goal: (P) 2 ([id number])  Complications: No apparent anesthesia complications

## 2016-06-30 NOTE — Progress Notes (Signed)
Pt placed on cpap with home setting of 14 cmH2O.  Pt uncomfortable and RT adjust setting to 8 for per comfort.  Pt tolerating and resting well.

## 2016-06-30 NOTE — Progress Notes (Signed)
Awake, alert, conversant.  MAEW.  Full strength both D/B/T/HI.  Doing well.

## 2016-06-30 NOTE — Anesthesia Procedure Notes (Addendum)
Procedure Name: Intubation Date/Time: 06/30/2016 7:43 AM Performed by: Valda Favia Pre-anesthesia Checklist: Patient identified, Emergency Drugs available, Suction available and Patient being monitored Patient Re-evaluated:Patient Re-evaluated prior to inductionOxygen Delivery Method: Circle system utilized Preoxygenation: Pre-oxygenation with 100% oxygen Intubation Type: IV induction Ventilation: Mask ventilation without difficulty and Oral airway inserted - appropriate to patient size Laryngoscope Size: Mac and 3 Grade View: Grade I Tube type: Oral Tube size: 7.0 mm Number of attempts: 1 Airway Equipment and Method: Stylet Placement Confirmation: ETT inserted through vocal cords under direct vision,  positive ETCO2 and breath sounds checked- equal and bilateral Secured at: 22 cm Tube secured with: Tape Dental Injury: Teeth and Oropharynx as per pre-operative assessment  Comments: Performed by Albertha Ghee, SRNA

## 2016-06-30 NOTE — Anesthesia Preprocedure Evaluation (Signed)
Anesthesia Evaluation  Patient identified by MRN, date of birth, ID band Patient awake    Reviewed: Allergy & Precautions, NPO status , Patient's Chart, lab work & pertinent test results  Airway Mallampati: II  TM Distance: >3 FB Neck ROM: Full    Dental  (+) Teeth Intact, Dental Advisory Given   Pulmonary former smoker,    breath sounds clear to auscultation       Cardiovascular  Rhythm:Regular Rate:Normal     Neuro/Psych    GI/Hepatic   Endo/Other    Renal/GU      Musculoskeletal   Abdominal   Peds  Hematology   Anesthesia Other Findings   Reproductive/Obstetrics                             Anesthesia Physical Anesthesia Plan  ASA: II  Anesthesia Plan: General   Post-op Pain Management:    Induction: Intravenous  Airway Management Planned: Oral ETT  Additional Equipment:   Intra-op Plan:   Post-operative Plan: Extubation in OR  Informed Consent: I have reviewed the patients History and Physical, chart, labs and discussed the procedure including the risks, benefits and alternatives for the proposed anesthesia with the patient or authorized representative who has indicated his/her understanding and acceptance.   Dental advisory given  Plan Discussed with: CRNA and Anesthesiologist  Anesthesia Plan Comments:         Anesthesia Quick Evaluation  

## 2016-06-30 NOTE — Interval H&P Note (Signed)
History and Physical Interval Note:  06/30/2016 7:18 AM  Rose Boyer  has presented today for surgery, with the diagnosis of CERVICAL HERNIATED DISC  The various methods of treatment have been discussed with the patient and family. After consideration of risks, benefits and other options for treatment, the patient has consented to  Procedure(s) with comments: EXPLORATION OF C4-6 FUSION/REMOVAL OF NUVASIVE HARDWARE/REDO ANTERIOR CERVICAL DECOMPRESSION/DISKECTOMY FUSION AT C5-6 WITH C3-4, C6-7 ANTERIOR CERVICAL DECOMPRESSION/DISKECTOMY/FUSION (N/A) - EXPLORATION OF C4-6 FUSION/REMOVAL OF NUVASIVE HARDWARE/REDO ANTERIOR CERVICAL DECOMPRESSION/DISKECTOMY FUSION AT C5-6 WITH C3-4, C6-7 ANTERIOR CERVICAL DECOMPRESSION/DISKECTOMY/FUSION as a surgical intervention .  The patient's history has been reviewed, patient examined, no change in status, stable for surgery.  I have reviewed the patient's chart and labs.  Questions were answered to the patient's satisfaction.     Yesica Kemler D

## 2016-06-30 NOTE — H&P (Signed)
Patient ID:   [id number] Patient: Rose Boyer  Date of Birth: 1956-04-02 Visit Type: Office Visit   Date: 04/27/2016 09:00 AM Provider: Marchia Meiers. Vertell Limber MD   This 60 year old female presents for neck pain.  History of Present Illness: 1.  neck pain    The patient had an appointment with Dr. Maryjean Ka today for pain management. I went over to review her CT and MRI with Dr. Maryjean Ka and we both believe surgical intervention is warranted. She is having miserable radicular UE pain.  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  Last detailed document date:08/12/2013.   PAST MEDICAL HISTORY, SURGICAL HISTORY, FAMILY HISTORY, SOCIAL HISTORY AND REVIEW OF SYSTEMS  03/02/2016, which I have signed.  Family History: Reviewed, no changes.  Last detailed document: 08/12/2013.   Social History: Tobacco use reviewed. Reviewed, no changes. Last detailed document date: 08/12/2013.      MEDICATIONS(added, continued or stopped this visit): Started Medication Directions Instruction Stopped   Armour Thyroid 90 mg tablet 1 tablet daily     hydrochlorothiazide 50 mg tablet take 1 tablet by oral route  every day    01/28/2016 IBUPROFEN 800 MG TABLET take 1 - 2 tablet by oral route every day    03/23/2016 Nucynta 100 mg tablet take 1 tablet by oral route  every 4 - 6 hours as needed for moderate pain, NTE 5/day  04/27/2016  04/27/2016 oxycodone 10 mg tablet take 1 tablet by oral route every 6 hours as needed for pain    03/23/2016 oxycodone 10 mg tablet take 1 tablet by oral route every 8 hours as needed for SEVERE pain  04/27/2016  09/25/2013 tizanidine 2 mg tablet 1-2 po q6hrs prn spasms     Wellbutrin XL 300 mg 24 hr tablet, extended release take 1 tablet by oral route  every day       ALLERGIES: Ingredient Reaction  Medication Name Comment  NO KNOWN ALLERGIES     No known allergies.    Vitals Date Temp F BP Pulse Ht In Wt Lb BMI BSA Pain Score  04/27/2016  131/84 94 69 210 31.01  5/10      IMPRESSION The patient is experiencing miserable radicular UE pain as she was scheduled to see Dr. Maryjean Ka for pain managemnt today. 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 On confrontational testing, she has a positive Spurlings to the right with no weakness. I do not believe injections will offer relief, so I recommend a C3-4, C6-7 ACDF with exploration of previous fusion and redo C5-6 ACDF.   Completed Orders (this encounter) Order Details Reason Side Interpretation Result Initial Treatment Date Region  Lifestyle education follow up with primary physcian for elevated blood pressure.        Giving encouragement to exercise Encourage patient to eat a well balanced diet and follow up with primary physcian.         Assessment/Plan # Detail Type Description   1. Assessment Essential (primary) hypertension (I10).       2. Assessment Body mass index (BMI) 31.0-31.9, adult (Z68.31).   Plan Orders Today's instructions / counseling include(s) Giving encouragement to exercise.  3. Assessment Herniated nucleus pulposus, cervical (M50.20).   Plan Orders Vista Hard Collar Universal Se.       4. 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       5. 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       6. Assessment Cervical radiculopathy (M54.12).         Pain Assessment/Treatment Pain Scale: 5/10. Method: Numeric Pain Intensity Scale. Location: neck. Onset: 12/20/2012. Duration: varies. Quality: discomforting. Pain Assessment/Treatment follow-up plan of care: Pateint taking medication as prescribed..  Fall Risk  Plan The patient has not fallen in the last year.  Schedule C3-4, C6-7 ACDF with exploration of previous C4-6 fusion and redo C5-6 ACDF. Nurse education given. Fitted for Vista collar.   Orders: Diagnostic Procedures: Assessment Procedure  M54.12 Cervical Spine- Lateral  Instruction(s)/Education: Assessment Instruction  I10 Lifestyle education  Z68.31 Giving encouragement to exercise  Miscellaneous: Assessment   M50.20 Vista Hard Collar Universal Se             Provider:  Marchia Meiers. Vertell Limber MD  04/27/2016 10:29 AM Dictation edited by: Johnella Moloney    CC Providers: [facility] [address] [place], [place] [address]-              Electronically signed by Marchia Meiers. Vertell Limber MD on 04/28/2016 05:01 PM

## 2016-07-01 DIAGNOSIS — M960 Pseudarthrosis after fusion or arthrodesis: Secondary | ICD-10-CM | POA: Diagnosis not present

## 2016-07-01 DIAGNOSIS — M5011 Cervical disc disorder with radiculopathy,  high cervical region: Secondary | ICD-10-CM | POA: Diagnosis not present

## 2016-07-01 DIAGNOSIS — M4802 Spinal stenosis, cervical region: Secondary | ICD-10-CM | POA: Diagnosis not present

## 2016-07-01 DIAGNOSIS — Z87891 Personal history of nicotine dependence: Secondary | ICD-10-CM | POA: Diagnosis not present

## 2016-07-01 NOTE — Progress Notes (Addendum)
Subjective: Patient reports "I think I'm doing well"  Objective: Vital signs in last 24 hours: Temp:  [97.9 F (36.6 C)-98.7 F (37.1 C)] 98 F (36.7 C) (12/08 0411) Pulse Rate:  [74-117] 74 (12/08 0411) Resp:  [8-21] 20 (12/08 0411) BP: (94-145)/(51-87) 94/51 (12/08 0411) SpO2:  [96 %-100 %] 98 % (12/08 0411)  Intake/Output from previous day: 12/07 0701 - 12/08 0700 In: [place] [P.O.:720; I.V.:1000] Out: 220 [Drains:95; Blood:125] Intake/Output this shift: No intake/output data recorded.  Alert, conversant. Good strength BUE. Incision flat, without erythhema or drainager. JP patent, ~30ml overnight. Visa collar in use.   Lab Results:  Recent Labs  06/30/16 0647  HGB 12.2  HCT 36.0   BMET  Recent Labs  06/30/16 0647  NA 135  K 2.9*  GLUCOSE 98    Studies/Results: Dg Cervical Spine 1 View  Result Date: 06/30/2016 CLINICAL DATA:  Hardware removal and redo cervical fusion EXAM: CERVICAL SPINE 1 VIEW COMPARISON:  04/18/2016 FINDINGS: Single lateral view was obtained intraoperatively and reveals anterior fixation at C3-4 and C5-6. Changes of fusion from C3-C6 are noted as well. No acute abnormality is seen. IMPRESSION: Cervical fusion. Electronically Signed   By: Inez Catalina M.D.   On: 06/30/2016 11:02    Assessment/Plan: Improving  LOS: 1 day  Per DrStern, d/c JP drain, d/c IV, d/c to home. She has pain med rx's from [facility]. Robaxin 500mg  1po up to tid rx from DrStern. Pt verbalizes understanding of d/c instructions. She has f/u appt with DrStern in 3 weeks.     Verdis Prime 07/01/2016, 7:13 AM   Patient doing very nicely.  Discharge home.

## 2016-07-01 NOTE — Discharge Instructions (Signed)

## 2016-07-01 NOTE — Progress Notes (Signed)
OT Cancellation Note  Patient Details Name: Rose Boyer MRN: [id number] DOB: Jan 13, 1956   Cancelled Treatment:    Reason Eval/Treat Not Completed: OT screened, no needs identified, will sign off. Pt reports she has had prior neck sx and recalls precautions and ADL techniques. No questions or concerns for OT at this time. Will screen and sign off at this time. Please re-consult if needs change. Thank you for this referral.  Binnie Kand M.S., OTR/L Pager: [phone]  07/01/2016, 9:26 AM

## 2016-07-01 NOTE — Progress Notes (Signed)
Patient alert and oriented, mae's well, voiding adequate amount of urine, swallowing without difficulty, no c/o pain at time of discharge. Patient discharged home with family. Script and discharged instructions given to patient. Patient and family stated understanding of instructions given. Patient has an appointment with Dr.Stern    

## 2016-07-01 NOTE — Consult Note (Signed)
   [facility] CM Inpatient Consult   07/01/2016  Rose Boyer 1956/07/09 [id number]   Came to visit [name] at bedside on behalf of Link to [facility] Care Management program for [facility] employees/dependents with [facility] insurance. Explained Link to [employer]. Denies any Link to Wellness need at this time. Provided Link to [employer], contact information, and 24-hr nurse line magnet. Confirmed best contact information as [phone] for post discharge call. Provided  Benefits number to contact as she tearfully reports her husband passed 18 days ago and she needs to contact HR to make aware to remove from her benefits. Convalescences offered. Appreciative of visit.  Will refer for post discharge call.    Marthenia Rolling, MSN-Ed, RN,BSN [facility] Liaison [phone]

## 2016-07-01 NOTE — Discharge Summary (Addendum)
Physician Discharge Summary  Patient ID: Rose Boyer MRN: [id number] DOB/AGE: 12-02-1955 60 y.o.  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 C 54 with neck pain and cervical radiculopathy s/p EXPLORATION OF CERVICAL FOUR-SIX FUSION/REMOVAL OF NUVASIVE HARDWARE/REDO ANTERIOR CERVICAL DECOMPRESSION/DISKECTOMY FUSION AT CERVICAL FIVE- SIX WITH CERVICAL THREE- FOUR, CERVICAL SIX - SEVEN ANTERIOR CERVICAL DECOMPRESSION/DISKECTOMY/FUSION, PLATING CERVICAL THREE-CERVICAL FOUR AND CERVICAL FIVE- CERVICAL SEVEN (N/A) - EXPLORATION OF C4-6 FUSION/REMOVAL OF NUVASIVE HARDWARE/REDO ANTERIOR CERVICAL DECOMPRESSION/DISKECTOMY FUSION AT C5-6 WITH C3-4, C6-7 ANTERIOR CERVICAL DECOMPRESSION/DISKECTOMY/FUSION with plating C 3 - C 4 and C 5 - C 7 levels    Active Problems:   Cervical pseudoarthrosis [facility])   Discharged Condition: good  Hospital Course: [name] was admitted for surgery with dx HNP and pseudoarthrosis and radiculopathy. Following uncomplicated surgery, she recovered nicely and transferred to [facility] for nursing care. She has mobilized well.   Consults: None  Significant Diagnostic Studies: radiology: X-Ray: intra-op  Treatments: surgery: EXPLORATION OF CERVICAL FOUR-SIX FUSION/REMOVAL OF NUVASIVE HARDWARE/REDO ANTERIOR CERVICAL DECOMPRESSION/DISKECTOMY FUSION AT CERVICAL FIVE- SIX WITH CERVICAL THREE- FOUR, CERVICAL SIX - SEVEN ANTERIOR CERVICAL DECOMPRESSION/DISKECTOMY/FUSION, PLATING CERVICAL THREE-CERVICAL FOUR AND CERVICAL FIVE- CERVICAL SEVEN (N/A) - EXPLORATION OF C4-6 FUSION/REMOVAL OF NUVASIVE HARDWARE/REDO ANTERIOR CERVICAL DECOMPRESSION/DISKECTOMY FUSION AT C5-6 WITH C3-4, C6-7 ANTERIOR CERVICAL DECOMPRESSION/DISKECTOMY/FUSION with plating C 3 - C 4 and C 5 - C 7  levels     Discharge Exam: Blood pressure (!) 94/51, pulse 74, temperature 98 F (36.7 C), temperature source Oral, resp. rate 20, height 5\' 9"  (1.753 m), weight 88.9 kg (196 lb), SpO2 98 %. Repeat BP at 0745 105/62.  Alert, conversant. Good strength BUE. Incision and drain site flat, without erythhema or drainager. Visa collar in use.   Disposition: 01-Home or Self Care  She has pain med rx's from DrHarkins. Robaxin 500mg  1po up to tid rx from DrStern. Pt verbalizes understanding of d/c instructions. She has f/u appt with DrStern in 3 weeks.      Medication List    STOP taking these medications   ibuprofen 800 MG tablet Commonly known as:  ADVIL,MOTRIN     TAKE these medications   ALPRAZolam 0.5 MG tablet Commonly known as:  XANAX Take 0.5 mg by mouth at bedtime.   buPROPion 300 MG 24 hr tablet Commonly known as:  WELLBUTRIN XL Take 300 mg by mouth daily.   hydrochlorothiazide 50 MG tablet Commonly known as:  HYDRODIURIL Take 100 mg by mouth daily.   Oxycodone HCl 10 MG Tabs Take 10 mg by mouth 4 (four) times daily as needed. For pain   oxymorphone 10 MG 12 hr tablet Commonly known as:  OPANA ER Take 10 mg by mouth 2 (two) times daily.   senna 8.6 MG Tabs tablet Commonly known as:  SENOKOT Take 1 tablet by mouth daily.   thyroid 90 MG tablet Commonly known as:  ARMOUR Take 90 mg by mouth daily.        Signed: Verdis Prime 07/01/2016, 7:44 AM   Doing well.  Discharge home.

## 2016-07-04 ENCOUNTER — Encounter (HOSPITAL_COMMUNITY): Payer: Self-pay | Admitting: Neurosurgery

## 2016-07-05 ENCOUNTER — Other Ambulatory Visit: Payer: Self-pay | Admitting: *Deleted

## 2016-07-05 NOTE — Patient Outreach (Addendum)
[facility]) Care Management  07/05/2016  DELESA GREIFF 08/03/1955 [id number]   Subjective: Telephone call to patient's home number, no answer, left HIPAA compliant voicemail message, and requested call back.    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   Status post EXPLORATION OF CERVICAL FOUR-SIX FUSION/REMOVAL OF NUVASIVE HARDWARE/REDO ANTERIOR CERVICAL DECOMPRESSION/DISKECTOMY FUSION AT CERVICAL FIVE- SIX WITH CERVICAL THREE- FOUR, CERVICAL SIX - SEVEN ANTERIOR CERVICAL DECOMPRESSION/DISKECTOMY/FUSION, PLATING CERVICAL THREE-CERVICAL FOUR AND CERVICAL FIVE- CERVICAL SEVEN (N/A) - EXPLORATION OF C4-6 FUSION/REMOVAL OF NUVASIVE HARDWARE/REDO ANTERIOR CERVICAL DECOMPRESSION/DISKECTOMY FUSION AT C5-6 WITH C3-4, C6-7 ANTERIOR CERVICAL DECOMPRESSION/DISKECTOMY/FUSION with plating C 3 - C 4 and C 5 - C 7 levels on 06/30/16.     Assessment: Received UMR Transition of care referral on 07/01/16.    Transition of care follow up pending patient contact.   Plan: RNCM will call patient for 2nd telephone outreach attempt, within 10 business days, if no return call.   Issiac Jamar H. Annia Friendly, BSN, [place] Management [facility] Telephonic CM Phone: [phone] Fax: [phone]

## 2016-07-07 ENCOUNTER — Ambulatory Visit: Payer: 59 | Admitting: *Deleted

## 2016-07-08 ENCOUNTER — Ambulatory Visit: Payer: Self-pay | Admitting: *Deleted

## 2016-07-08 MED FILL — oxyCODONE HCL 10 MG TABS: 10 | 30 days supply | Qty: 120 | Fill #0

## 2016-07-08 MED FILL — OXYMORPHONE HCL ER 10 MG TA: 10 | 30 days supply | Qty: 60 | Fill #0

## 2016-07-11 ENCOUNTER — Other Ambulatory Visit: Payer: Self-pay | Admitting: *Deleted

## 2016-07-11 NOTE — Patient Outreach (Addendum)
[facility]) Care Management  07/11/2016  Rose Boyer 04-29-1956 [id number]   Subjective: Telephone call to patient's home number, no answer, left HIPAA compliant voicemail message, and requested call back.    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   Status post EXPLORATION OF CERVICAL FOUR-SIX FUSION/REMOVAL OF NUVASIVE HARDWARE/REDO ANTERIOR CERVICAL DECOMPRESSION/DISKECTOMY FUSION AT CERVICAL FIVE- SIX WITH CERVICAL THREE- FOUR, CERVICAL SIX - SEVEN ANTERIOR CERVICAL DECOMPRESSION/DISKECTOMY/FUSION, PLATING CERVICAL THREE-CERVICAL FOUR AND CERVICAL FIVE- CERVICAL SEVEN (N/A) - EXPLORATION OF C4-6 FUSION/REMOVAL OF NUVASIVE HARDWARE/REDO ANTERIOR CERVICAL DECOMPRESSION/DISKECTOMY FUSION AT C5-6 WITH C3-4, C6-7 ANTERIOR CERVICAL DECOMPRESSION/DISKECTOMY/FUSION with plating C 3 - C 4 and C 5 - C 7 levels on 06/30/16.     Assessment: Received UMR Transition of care referral on 07/01/16.    Transition of care follow up pending patient contact.   Plan: RNCM will call patient for 3rd telephone outreach attempt, within 10 business days, if no return call.   Tharon Kitch H. Annia Friendly, BSN, [place] Management [facility] Telephonic CM Phone: [phone] Fax: [phone]

## 2016-07-12 ENCOUNTER — Other Ambulatory Visit: Payer: Self-pay | Admitting: *Deleted

## 2016-07-12 ENCOUNTER — Encounter: Payer: Self-pay | Admitting: *Deleted

## 2016-07-12 NOTE — Patient Outreach (Addendum)
[facility]) Care Management  07/12/2016  Rose Boyer Oct 25, 1955 [id number]   Subjective:Telephone call to patient's home number, no answer, left HIPAA compliant voicemail message, and requested call back.    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 Status post EXPLORATION OF CERVICAL FOUR-SIX FUSION/REMOVAL OF NUVASIVE HARDWARE/REDO ANTERIOR CERVICAL DECOMPRESSION/DISKECTOMY FUSION AT CERVICAL FIVE- SIX WITH CERVICAL THREE- FOUR, CERVICAL SIX - SEVEN ANTERIOR CERVICAL DECOMPRESSION/DISKECTOMY/FUSION, PLATING CERVICAL THREE-CERVICAL FOUR AND CERVICAL FIVE- CERVICAL SEVEN (N/A) - EXPLORATION OF C4-6 FUSION/REMOVAL OF NUVASIVE HARDWARE/REDO ANTERIOR CERVICAL DECOMPRESSION/DISKECTOMY FUSION AT C5-6 WITH C3-4, C6-7 ANTERIOR CERVICAL DECOMPRESSION/DISKECTOMY/FUSION with plating C 3 - C 4 and C 5 - C 7 levels on 06/30/16.    Assessment: Received UMR Transition of care referral on 07/01/16. Transition of care follow up pending patient contact.   Plan: RNCM will send patient unsuccessful outreach letter, [facility] pamphlet, and proceed with case closure within 10 business days, if no return call.    Sheldon Sem H. Annia Friendly, BSN, [place] Management [facility] Telephonic CM Phone: [phone] Fax: [phone]

## 2016-07-22 DIAGNOSIS — G4733 Obstructive sleep apnea (adult) (pediatric): Secondary | ICD-10-CM | POA: Diagnosis not present

## 2016-07-22 MED FILL — HYDROCHLOROTHIAZIDE 50 MG T: 50 | 90 days supply | Qty: 180 | Fill #1

## 2016-07-22 MED FILL — ARMOUR THYROID 90 MG TABLET: 90 | 90 days supply | Qty: 90 | Fill #3

## 2016-07-22 MED FILL — BUPROPION HCL XL 300 MG TAB: 300 | 90 days supply | Qty: 90 | Fill #1

## 2016-07-22 MED FILL — ALPRAZolam 0.5 MG TABS: 0.5 | 30 days supply | Qty: 60 | Fill #1

## 2016-07-26 ENCOUNTER — Other Ambulatory Visit: Payer: Self-pay | Admitting: *Deleted

## 2016-07-26 NOTE — Patient Outreach (Addendum)
[facility]) Care Management  07/26/2016  Rose Boyer 01/02/1956 [id number]   No response to patient outreach attempts, will proceed with case closure.  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 Status post EXPLORATION OF CERVICAL FOUR-SIX FUSION/REMOVAL OF NUVASIVE HARDWARE/REDO ANTERIOR CERVICAL DECOMPRESSION/DISKECTOMY FUSION AT CERVICAL FIVE- SIX WITH CERVICAL THREE- FOUR, CERVICAL SIX - SEVEN ANTERIOR CERVICAL DECOMPRESSION/DISKECTOMY/FUSION, PLATING CERVICAL THREE-CERVICAL FOUR AND CERVICAL FIVE- CERVICAL SEVEN (N/A) - EXPLORATION OF C4-6 FUSION/REMOVAL OF NUVASIVE HARDWARE/REDO ANTERIOR CERVICAL DECOMPRESSION/DISKECTOMY FUSION AT C5-6 WITH C3-4, C6-7 ANTERIOR CERVICAL DECOMPRESSION/DISKECTOMY/FUSION with plating C 3 - C 4 and C 5 - C 7 levels on 06/30/16.    Assessment: Received UMR Transition of care referral on 07/01/16. Transition of care follow up not completed, patient unable to contact, and will proceed with case closure.    Plan: RNCM will send case closure request due to unable to contact to [facility] [place] Management.     Yanelie Abraha H. Annia Friendly, BSN, [facility] Telephonic CM Phone: [phone] Fax: [phone]

## 2016-07-27 ENCOUNTER — Encounter: Payer: Self-pay | Admitting: Pulmonary Disease

## 2016-07-29 DIAGNOSIS — G4733 Obstructive sleep apnea (adult) (pediatric): Secondary | ICD-10-CM | POA: Diagnosis not present

## 2016-08-03 DIAGNOSIS — M5020 Other cervical disc displacement, unspecified cervical region: Secondary | ICD-10-CM | POA: Diagnosis not present

## 2016-08-03 DIAGNOSIS — M5010 Cervical disc disorder with radiculopathy, unspecified cervical region: Secondary | ICD-10-CM | POA: Diagnosis not present

## 2016-08-03 DIAGNOSIS — M5412 Radiculopathy, cervical region: Secondary | ICD-10-CM | POA: Diagnosis not present

## 2016-08-03 DIAGNOSIS — M542 Cervicalgia: Secondary | ICD-10-CM | POA: Diagnosis not present

## 2016-08-03 DIAGNOSIS — Z6828 Body mass index (BMI) 28.0-28.9, adult: Secondary | ICD-10-CM | POA: Diagnosis not present

## 2016-08-03 DIAGNOSIS — M4802 Spinal stenosis, cervical region: Secondary | ICD-10-CM | POA: Diagnosis not present

## 2016-08-03 MED FILL — OXYMORPHONE HCL ER 10 MG TA: 10 | 30 days supply | Qty: 60 | Fill #0

## 2016-08-03 MED FILL — oxyCODONE HCL 5 MG TABS: 5 | 20 days supply | Qty: 120 | Fill #0

## 2016-08-22 DIAGNOSIS — G4733 Obstructive sleep apnea (adult) (pediatric): Secondary | ICD-10-CM | POA: Diagnosis not present

## 2016-08-24 DIAGNOSIS — M542 Cervicalgia: Secondary | ICD-10-CM | POA: Diagnosis not present

## 2016-08-29 DIAGNOSIS — G4733 Obstructive sleep apnea (adult) (pediatric): Secondary | ICD-10-CM | POA: Diagnosis not present

## 2016-09-05 MED FILL — OXYMORPHONE HCL ER 10 MG TA: 10 | 30 days supply | Qty: 60 | Fill #0

## 2016-09-05 MED FILL — oxyCODONE HCL 5 MG TABS: 5 | 20 days supply | Qty: 120 | Fill #0

## 2016-09-06 ENCOUNTER — Encounter: Payer: Self-pay | Admitting: Pulmonary Disease

## 2016-09-08 ENCOUNTER — Encounter: Payer: Self-pay | Admitting: Pulmonary Disease

## 2016-09-08 ENCOUNTER — Ambulatory Visit (INDEPENDENT_AMBULATORY_CARE_PROVIDER_SITE_OTHER): Payer: 59 | Admitting: Pulmonary Disease

## 2016-09-08 DIAGNOSIS — Z9989 Dependence on other enabling machines and devices: Secondary | ICD-10-CM

## 2016-09-08 DIAGNOSIS — M4712 Other spondylosis with myelopathy, cervical region: Secondary | ICD-10-CM | POA: Diagnosis not present

## 2016-09-08 DIAGNOSIS — M4722 Other spondylosis with radiculopathy, cervical region: Secondary | ICD-10-CM

## 2016-09-08 DIAGNOSIS — G4733 Obstructive sleep apnea (adult) (pediatric): Secondary | ICD-10-CM | POA: Diagnosis not present

## 2016-09-08 NOTE — Assessment & Plan Note (Signed)
Would like to review CPAP download off narcotics and benzodiazepines

## 2016-09-08 NOTE — Patient Instructions (Addendum)
Use nasal pads Try full face mask " for HER" next time you are due for a mask change  We will take her prescriptions for CPAP supplies

## 2016-09-08 NOTE — Assessment & Plan Note (Signed)
We discussed issues of nasal pressure, dryness of mouth and mask  Use nasal pads Try full face mask " for HER" next time you are due for a mask change  We will take care of her prescriptions for CPAP supplies  Weight loss encouraged, compliance with goal of at least 4-6 hrs every night is the expectation. Advised against medications with sedative side effects Cautioned against driving when sleepy - understanding that sleepiness will vary on a day to day basis

## 2016-09-08 NOTE — Progress Notes (Signed)
   Subjective:    Patient ID: Rose Boyer, female    DOB: 07-Aug-1955, 61 y.o.   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    09/08/2016 Chief Complaint  Patient presents with  . Follow-up    For OSA. Pt. has started CPAP a few weeks ago for snoring.    Unfortunately her husband died of a massive heart attack a few months ago and she is still grieving. She received her CPAP machine in December 2017 and has had good results with this. She reports nasal pressure in spite of changing her fullface mask. She also reports extreme dryness of mouth. She remains on narcotics and has started using some Xanax 1 mg to help her sleep at night.  CPAP download shows residual AHI of 8/hour on CPAP of 14 cm with excellent compliance and minimal leak no differential of obstructive versus central apneas as provided.    Significant tests/ events  04/2016 PSG showed severe OSA AHI 49/h ,corrected by CPAP 14 cm, SM FF mask    Review of Systems Patient denies significant dyspnea,cough, hemoptysis,  chest pain, palpitations, pedal edema, orthopnea, paroxysmal nocturnal dyspnea, lightheadedness, nausea, vomiting, abdominal or  leg pains      Objective:   Physical Exam  Gen. Pleasant, obese, in no distress ENT - no lesions, no post nasal drip Neck: No JVD, no thyromegaly, no carotid bruits Lungs: no use of accessory muscles, no dullness to percussion, decreased without rales or rhonchi  Cardiovascular: Rhythm regular, heart sounds  normal, no murmurs or gallops, no peripheral edema Musculoskeletal: No deformities, no cyanosis or clubbing , no tremors        Assessment & Plan:

## 2016-09-14 DIAGNOSIS — M5412 Radiculopathy, cervical region: Secondary | ICD-10-CM | POA: Diagnosis not present

## 2016-09-14 DIAGNOSIS — M4802 Spinal stenosis, cervical region: Secondary | ICD-10-CM | POA: Diagnosis not present

## 2016-09-14 DIAGNOSIS — M5020 Other cervical disc displacement, unspecified cervical region: Secondary | ICD-10-CM | POA: Diagnosis not present

## 2016-09-14 DIAGNOSIS — Z6828 Body mass index (BMI) 28.0-28.9, adult: Secondary | ICD-10-CM | POA: Diagnosis not present

## 2016-09-14 DIAGNOSIS — M542 Cervicalgia: Secondary | ICD-10-CM | POA: Diagnosis not present

## 2016-09-21 DIAGNOSIS — G4733 Obstructive sleep apnea (adult) (pediatric): Secondary | ICD-10-CM | POA: Diagnosis not present

## 2016-09-21 MED FILL — ALPRAZolam 0.5 MG TABS: 0.5 | 30 days supply | Qty: 90 | Fill #0

## 2016-09-26 DIAGNOSIS — G4733 Obstructive sleep apnea (adult) (pediatric): Secondary | ICD-10-CM | POA: Diagnosis not present

## 2016-10-03 MED FILL — OXYMORPHONE HCL ER 5 MG TAB: 5 | 30 days supply | Qty: 60 | Fill #0

## 2016-10-04 MED FILL — oxyCODONE HCL 5 MG TABS: 5 | 20 days supply | Qty: 120 | Fill #0

## 2016-10-10 DIAGNOSIS — E039 Hypothyroidism, unspecified: Secondary | ICD-10-CM | POA: Diagnosis not present

## 2016-10-14 DIAGNOSIS — G4733 Obstructive sleep apnea (adult) (pediatric): Secondary | ICD-10-CM | POA: Diagnosis not present

## 2016-10-17 DIAGNOSIS — E049 Nontoxic goiter, unspecified: Secondary | ICD-10-CM | POA: Diagnosis not present

## 2016-10-17 DIAGNOSIS — E039 Hypothyroidism, unspecified: Secondary | ICD-10-CM | POA: Diagnosis not present

## 2016-10-17 MED FILL — LEVOTHYROXINE 125 MCG TAB: 125 | 30 days supply | Qty: 30 | Fill #0

## 2016-10-19 MED FILL — ALPRAZolam 0.5 MG TABS: 0.5 | 30 days supply | Qty: 90 | Fill #1

## 2016-10-27 DIAGNOSIS — G4733 Obstructive sleep apnea (adult) (pediatric): Secondary | ICD-10-CM | POA: Diagnosis not present

## 2016-11-04 MED FILL — HYDROCHLOROTHIAZIDE 50 MG T: 50 | 90 days supply | Qty: 180 | Fill #2

## 2016-11-04 MED FILL — BUPROPION HCL XL 300 MG TAB: 300 | 90 days supply | Qty: 90 | Fill #2

## 2016-11-15 MED FILL — LEVOTHYROXINE 125 MCG TAB: 125 | 30 days supply | Qty: 30 | Fill #1

## 2016-11-15 MED FILL — ALPRAZolam 0.5 MG TABS: 0.5 | 30 days supply | Qty: 90 | Fill #0

## 2016-11-24 MED FILL — oxyCODONE HCL 5 MG TABS: 5 | 25 days supply | Qty: 100 | Fill #0

## 2016-11-24 MED FILL — OXYMORPHONE HCL ER 5 MG TAB: 5 | 30 days supply | Qty: 60 | Fill #0

## 2016-11-30 ENCOUNTER — Other Ambulatory Visit: Payer: Self-pay | Admitting: Obstetrics & Gynecology

## 2016-11-30 ENCOUNTER — Ambulatory Visit (INDEPENDENT_AMBULATORY_CARE_PROVIDER_SITE_OTHER): Payer: 59

## 2016-11-30 DIAGNOSIS — Z1231 Encounter for screening mammogram for malignant neoplasm of breast: Secondary | ICD-10-CM

## 2016-12-01 DIAGNOSIS — F329 Major depressive disorder, single episode, unspecified: Secondary | ICD-10-CM | POA: Diagnosis not present

## 2016-12-01 DIAGNOSIS — F411 Generalized anxiety disorder: Secondary | ICD-10-CM | POA: Diagnosis not present

## 2016-12-01 MED FILL — FLUoxetine HCL 20 MG CAPS: 20 | 30 days supply | Qty: 30 | Fill #0

## 2016-12-06 ENCOUNTER — Encounter: Payer: Self-pay | Admitting: Gastroenterology

## 2016-12-14 DIAGNOSIS — M961 Postlaminectomy syndrome, not elsewhere classified: Secondary | ICD-10-CM | POA: Diagnosis not present

## 2016-12-14 DIAGNOSIS — M542 Cervicalgia: Secondary | ICD-10-CM | POA: Diagnosis not present

## 2016-12-15 DIAGNOSIS — E039 Hypothyroidism, unspecified: Secondary | ICD-10-CM | POA: Diagnosis not present

## 2016-12-20 MED FILL — ALPRAZolam 0.5 MG TABS: 0.5 | 30 days supply | Qty: 90 | Fill #1

## 2016-12-26 MED FILL — OXYMORPHONE HCL ER 5 MG TAB: 5 | 30 days supply | Qty: 60 | Fill #0

## 2016-12-26 MED FILL — FLUoxetine HCL 20 MG CAPS: 20 | 30 days supply | Qty: 30 | Fill #1

## 2016-12-26 MED FILL — oxyCODONE HCL 5 MG TABS: 5 | 17 days supply | Qty: 100 | Fill #0

## 2017-01-04 ENCOUNTER — Encounter: Payer: Self-pay | Admitting: Radiology

## 2017-01-16 ENCOUNTER — Emergency Department (INDEPENDENT_AMBULATORY_CARE_PROVIDER_SITE_OTHER)
Admission: EM | Admit: 2017-01-16 | Discharge: 2017-01-16 | Disposition: A | Discharge status: Home / Self Care | Payer: 59 | Source: Home / Self Care | Attending: Family Medicine | Admitting: Family Medicine

## 2017-01-16 ENCOUNTER — Encounter: Payer: Self-pay | Admitting: Emergency Medicine

## 2017-01-16 DIAGNOSIS — B9789 Other viral agents as the cause of diseases classified elsewhere: Secondary | ICD-10-CM | POA: Diagnosis not present

## 2017-01-16 DIAGNOSIS — J069 Acute upper respiratory infection, unspecified: Secondary | ICD-10-CM | POA: Diagnosis not present

## 2017-01-16 DIAGNOSIS — J029 Acute pharyngitis, unspecified: Secondary | ICD-10-CM

## 2017-01-16 MED ORDER — BENZONATATE 100 MG PO CAPS: 100.0000 mg | ORAL_CAPSULE | Freq: Three times a day (TID) | ORAL | 0 refills | Status: DC

## 2017-01-16 MED ORDER — AZITHROMYCIN 250 MG PO TABS: 250.0000 mg | ORAL_TABLET | Freq: Every day | ORAL | 0 refills | Status: DC

## 2017-01-16 MED FILL — BENZONATATE 100 MG CAP: 100 | 4 days supply | Qty: 21 | Fill #0

## 2017-01-16 NOTE — ED Provider Notes (Signed)
CSN: [id number]     Arrival date & time 01/16/17  1222 History   First MD Initiated Contact with Patient 01/16/17 1254     Chief Complaint  Patient presents with  . Sore Throat   (Consider location/radiation/quality/duration/timing/severity/associated sxs/prior Treatment) HPI Rose Boyer is a 61 y.o. female presenting to UC with c/o sore throat, hoarse voice, malaise and cough for 3.5 days.  She note she just return from [place] where she was staying at a resort. Others who were with her are not sick.  Denies chest pain, SOB, fever, chills, n/v/d. She has taken ibuprofen with moderate relief.  Pt works in [employer].   Past Medical History:  Diagnosis Date  . Anxiety   . Arthritis    hands   . Complication of anesthesia    wakes up crying   . Depression    takes Wellbutrin daily  . GERD (gastroesophageal reflux disease)    takes OTC med as needed  . Headache(784.0)    rarely  . History of colon polyps   . Hypothyroidism   . Impaired hearing    left;has hearing aids but doesn't wear them  . Meniere disease    takes HCTZ daily  . Peripheral edema   . Sleep apnea   . Spinal headache   . Thyroid disease    takes Thyroid Armour daily-hypothyroidism  . 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 Weakness    numbness and tingling   Past Surgical History:  Procedure Laterality Date  . ANTERIOR CERVICAL DECOMP/DISCECTOMY FUSION N/A 09/03/2013   Procedure: ANTERIOR CERVICAL DECOMPRESSION/DISCECTOMY FUSION 2 LEVELS;  Surgeon: Erline Levine, MD;  Location: [place] [facility];  Service: Neurosurgery;  Laterality: N/A;  C4-5 C5-6 Anterior cervical decompression/diskectomy/fusion  . ANTERIOR CERVICAL DECOMP/DISCECTOMY FUSION N/A 06/30/2016   Procedure: EXPLORATION OF CERVICAL FOUR-SIX FUSION/REMOVAL OF NUVASIVE HARDWARE/REDO ANTERIOR CERVICAL DECOMPRESSION/DISKECTOMY FUSION AT CERVICAL FIVE- SIX WITH CERVICAL THREE- FOUR, CERVICAL SIX - SEVEN ANTERIOR CERVICAL DECOMPRESSION/DISKECTOMY/FUSION,  PLATING CERVICAL THREE-CERVICAL FOUR AND CERVICAL FIVE- CERVICAL SEVEN;  Surgeon: Erline Levine, MD;  Location: [place];  Service: Neurosurgery;  Ruta Hinds Bilateral [id number]  . CESAREAN SECTION     twice  . CHOLECYSTECTOMY N/A 12/19/2014   Procedure: LAPAROSCOPIC CHOLECYSTECTOMY WITH INTRAOPERATIVE CHOLANGIOGRAM;  Surgeon: Jackolyn Confer, MD;  Location: [place];  Service: General;  Laterality: N/A;  . COLONOSCOPY    . COLONOSCOPY W/ BIOPSIES AND POLYPECTOMY    . DILATION AND CURETTAGE OF UTERUS    . TONSILLECTOMY     and adenoids as a child  . TUBAL LIGATION     Family History  Problem Relation Age of Onset  . Colon cancer Paternal Grandmother   . Breast cancer Mother   . Prostate cancer Father   . Seizures Father   . Hypertension Father   . Hyperlipidemia Neg Hx   . Heart attack Neg Hx   . Diabetes Neg Hx   . Sudden death Neg Hx    Social History  Substance Use Topics  . Smoking status: Former Smoker    Quit date: 09/29/1997  . Smokeless tobacco: Never Used     Comment: quit smoking Mar 8,1999  . Alcohol use 0.0 oz/week     Comment: rarely   OB History    Gravida Para Term Preterm AB Living   4 3 3   1 3    SAB TAB Ectopic Multiple Live Births   1     1  Review of Systems  Constitutional: Positive for fatigue. Negative for chills and fever.  HENT: Positive for congestion, sore throat and voice change. Negative for ear pain and trouble swallowing.   Respiratory: Positive for cough. Negative for shortness of breath.   Cardiovascular: Negative for chest pain and palpitations.  Gastrointestinal: Negative for abdominal pain, diarrhea, nausea and vomiting.  Musculoskeletal: Negative for arthralgias, back pain and myalgias.  Skin: Negative for rash.    Allergies  Patient has no known allergies.  Home Medications   Prior to Admission medications   Medication Sig Start Date End Date Taking? Authorizing Provider  FLUoxetine (PROZAC) 20 MG tablet Take 20 mg by  mouth daily.   Yes [provider]  ALPRAZolam Duanne Moron) 0.5 MG tablet Take 0.5 mg by mouth at bedtime.  04/13/15   [provider]  azithromycin (ZITHROMAX) 250 MG tablet Take 1 tablet (250 mg total) by mouth daily. Take first 2 tablets together, then 1 every day until finished. 01/16/17   Noe Gens, PA-C  benzonatate (TESSALON) 100 MG capsule Take 1-2 capsules (100-200 mg total) by mouth every 8 (eight) hours. 01/16/17   Noe Gens, PA-C  buPROPion (WELLBUTRIN XL) 300 MG 24 hr tablet Take 300 mg by mouth daily.      [provider]  hydrochlorothiazide 50 MG tablet Take 100 mg by mouth daily.     [provider]  Oxycodone HCl 10 MG TABS Take 5 mg by mouth 4 (four) times daily as needed. For pain 06/10/16   [provider]  oxymorphone (OPANA ER) 10 MG 12 hr tablet Take 10 mg by mouth 2 (two) times daily. 06/10/16   [provider]  senna (SENOKOT) 8.6 MG TABS tablet Take 1 tablet by mouth daily.    [provider]  thyroid (ARMOUR) 90 MG tablet Take 90 mg by mouth daily.      [provider]   Meds Ordered and Administered this Visit  Medications - No data to display  BP 123/74 (BP Location: Left Arm)   Pulse 74   Temp 98.6 F (37 C) (Oral)   Ht 5\' 9"  (1.753 m)   Wt 188 lb (85.3 kg)   SpO2 100%   BMI 27.76 kg/m  No data found.   Physical Exam  Constitutional: She is oriented to person, place, and time. She appears well-developed and well-nourished. No distress.  HENT:  Head: Normocephalic and atraumatic.  Right Ear: Tympanic membrane normal.  Left Ear: Tympanic membrane normal.  Nose: Nose normal.  Mouth/Throat: Uvula is midline, oropharynx is clear and moist and mucous membranes are normal.  Eyes: EOM are normal.  Neck: Normal range of motion. Neck supple.  Cardiovascular: Normal rate and regular rhythm.   Pulmonary/Chest: Effort normal and breath sounds normal. No stridor. No respiratory distress.  She has no wheezes. She has no rales.  Musculoskeletal: Normal range of motion.  Lymphadenopathy:    She has no cervical adenopathy.  Neurological: She is alert and oriented to person, place, and time.  Skin: Skin is warm and dry. She is not diaphoretic.  Psychiatric: She has a normal mood and affect. Her behavior is normal.  Nursing note and vitals reviewed.   Urgent Care Course     Procedures (including critical care time)  Labs Review Labs Reviewed - No data to display  Imaging Review No results found.   MDM   1. Viral URI with cough   2. Pharyngitis, unspecified etiology  Hx and exam c/w viral illness. Encouraged symptomatic treatment at this time, however, Prescription to hold with expiration date for azithromycin. Pt to fill if persistent fever develops or not improving in 1 week.   F/u with PCP in 1 week as needed.      Noe Gens, [place] 01/17/17 [phone]

## 2017-01-16 NOTE — ED Triage Notes (Signed)
Sore throat, hoarseness, malaise, cough x 3.5 days, just returned from [place].

## 2017-01-16 NOTE — Discharge Instructions (Signed)
°  Your symptoms are likely due to a virus such as the common cold, however, if you developing worsening chest congestion with shortness of breath, persistent fever (> 100.4*F) for 3 days, or symptoms not improving in 4-5 days, you may fill the antibiotic (azithromycin).  If you do fill the antibiotic,  please take antibiotics as prescribed and be sure to complete entire course even if you start to feel better to ensure infection does not come back. ° °

## 2017-01-17 MED FILL — AZITHROMYCIN 250 MG TABLET: 250 | 5 days supply | Qty: 6 | Fill #0

## 2017-01-23 MED FILL — HYDROCHLOROTHIAZIDE 50 MG T: 50 | 90 days supply | Qty: 180 | Fill #3

## 2017-01-23 MED FILL — oxyCODONE HCL 5 MG TABS: 5 | 17 days supply | Qty: 100 | Fill #0

## 2017-01-23 MED FILL — BUPROPION HCL XL 300 MG TAB: 300 | 90 days supply | Qty: 90 | Fill #3

## 2017-01-23 MED FILL — FLUoxetine HCL 20 MG CAPS: 20 | 30 days supply | Qty: 30 | Fill #2

## 2017-01-23 MED FILL — OXYMORPHONE HCL ER 5 MG TAB: 5 | 30 days supply | Qty: 60 | Fill #0

## 2017-01-26 MED FILL — ALPRAZolam 0.5 MG TABS: 0.5 | 30 days supply | Qty: 90 | Fill #0

## 2017-01-27 ENCOUNTER — Ambulatory Visit (AMBULATORY_SURGERY_CENTER): Payer: Self-pay | Admitting: *Deleted

## 2017-01-27 VITALS — Ht 69.0 in | Wt 187.2 lb

## 2017-01-27 DIAGNOSIS — Z86010 Personal history of colonic polyps: Secondary | ICD-10-CM

## 2017-01-27 NOTE — Progress Notes (Signed)
No egg or soy allergy known to patient  No issues with past sedation with any surgeries  or procedures, no intubation problems  No diet pills per patient No home 02 use per patient  No blood thinners per patient  Pt denies issues with constipation  No A fib or A flutter  EMMI video sent to pt's e mail - pt declined Pt had a previous PV and has a suprep bowel kit at home already

## 2017-01-31 ENCOUNTER — Encounter: Payer: Self-pay | Admitting: Gastroenterology

## 2017-02-07 DIAGNOSIS — G4733 Obstructive sleep apnea (adult) (pediatric): Secondary | ICD-10-CM | POA: Diagnosis not present

## 2017-02-10 ENCOUNTER — Encounter: Payer: Self-pay | Admitting: Gastroenterology

## 2017-02-10 ENCOUNTER — Ambulatory Visit (AMBULATORY_SURGERY_CENTER): Payer: 59 | Admitting: Gastroenterology

## 2017-02-10 VITALS — BP 137/76 | HR 65 | Temp 98.6°F | Resp 13 | Ht 69.0 in | Wt 187.0 lb

## 2017-02-10 DIAGNOSIS — E669 Obesity, unspecified: Secondary | ICD-10-CM | POA: Diagnosis not present

## 2017-02-10 DIAGNOSIS — G4733 Obstructive sleep apnea (adult) (pediatric): Secondary | ICD-10-CM | POA: Diagnosis not present

## 2017-02-10 DIAGNOSIS — Z86010 Personal history of colonic polyps: Secondary | ICD-10-CM

## 2017-02-10 DIAGNOSIS — I10 Essential (primary) hypertension: Secondary | ICD-10-CM | POA: Diagnosis not present

## 2017-02-10 MED ORDER — SODIUM CHLORIDE 0.9 % IV SOLN: 500.0000 mL | INTRAVENOUS | Status: AC

## 2017-02-10 NOTE — Progress Notes (Signed)
Report to PACU, RN, vss, BBS= Clear.  

## 2017-02-10 NOTE — Progress Notes (Signed)
No problems noted in the recovery room. maw 

## 2017-02-10 NOTE — Op Note (Signed)
[facility] Patient Name: Rose Boyer Procedure Date: 02/10/2017 1:21 PM MRN: [id number] Endoscopist: Mauri Pole , MD Age: 61 Referring MD:  Date of Birth: [birth year] Gender: Female Account #: [id number] Procedure:                Colonoscopy Indications:              High risk colon cancer surveillance: Personal                            history of colonic polyps, Surveillance: Personal                            history of adenomatous polyps on last colonoscopy >                            5 years ago, High risk colon cancer surveillance:                            Personal history of adenoma (10 mm or greater in                            size) Medicines:                Monitored Anesthesia Care Procedure:                Pre-Anesthesia Assessment:                           - Prior to the procedure, a History and Physical                            was performed, and patient medications and                            allergies were reviewed. The patient's tolerance of                            previous anesthesia was also reviewed. The risks                            and benefits of the procedure and the sedation                            options and risks were discussed with the patient.                            All questions were answered, and informed consent                            was obtained. Prior Anticoagulants: The patient has                            taken no previous anticoagulant or antiplatelet  agents. ASA Grade Assessment: II - A patient with                            mild systemic disease. After reviewing the risks                            and benefits, the patient was deemed in                            satisfactory condition to undergo the procedure.                           After obtaining informed consent, the colonoscope                            was passed under direct vision. Throughout the                         procedure, the patient's blood pressure, pulse, and                            oxygen saturations were monitored continuously. The                            Colonoscope was introduced through the anus and                            advanced to the the terminal ileum, with                            identification of the appendiceal orifice and IC                            valve. The colonoscopy was performed without                            difficulty. The patient tolerated the procedure                            well. The quality of the bowel preparation was                            excellent. The terminal ileum, ileocecal valve,                            appendiceal orifice, and rectum were photographed. Scope In: 1:28:34 PM Scope Out: 1:46:13 PM Scope Withdrawal Time: 0 hours 12 minutes 32 seconds  Total Procedure Duration: 0 hours 17 minutes 39 seconds  Findings:                 The perianal and digital rectal examinations were                            normal.  A patchy area of mild melanosis was found in the                            entire colon.                           A few small-mouthed diverticula were found in the                            sigmoid colon.                           Non-bleeding internal hemorrhoids were found during                            retroflexion. The hemorrhoids were small.                           The terminal ileum appeared normal.                           The exam was otherwise without abnormality. Complications:            No immediate complications. Estimated Blood Loss:     Estimated blood loss: none. Impression:               - Melanosis in the colon.                           - Diverticulosis in the sigmoid colon.                           - Non-bleeding internal hemorrhoids.                           - The examined portion of the ileum was normal.                           - The  examination was otherwise normal.                           - No specimens collected. Recommendation:           - Patient has a contact number available for                            emergencies. The signs and symptoms of potential                            delayed complications were discussed with the                            patient. Return to normal activities tomorrow.                            Written discharge instructions were provided to the  patient.                           - Resume previous diet.                           - Continue present medications.                           - Repeat colonoscopy in 5 years for surveillance.                           - Return to GI clinic PRN. Mauri Pole, MD 02/10/2017 1:52:02 PM This report has been signed electronically.

## 2017-02-10 NOTE — Patient Instructions (Signed)
YOU HAD AN ENDOSCOPIC PROCEDURE TODAY AT [facility]:   Refer to the procedure report that was given to you for any specific questions about what was found during the examination.  If the procedure report does not answer your questions, please call your gastroenterologist to clarify.  If you requested that your care partner not be given the details of your procedure findings, then the procedure report has been included in a sealed envelope for you to review at your convenience later.  YOU SHOULD EXPECT: Some feelings of bloating in the abdomen. Passage of more gas than usual.  Walking can help get rid of the air that was put into your GI tract during the procedure and reduce the bloating. If you had a lower endoscopy (such as a colonoscopy or flexible sigmoidoscopy) you may notice spotting of blood in your stool or on the toilet paper. If you underwent a bowel prep for your procedure, you may not have a normal bowel movement for a few days.  Please Note:  You might notice some irritation and congestion in your nose or some drainage.  This is from the oxygen used during your procedure.  There is no need for concern and it should clear up in a day or so.  SYMPTOMS TO REPORT IMMEDIATELY:   Following lower endoscopy (colonoscopy or flexible sigmoidoscopy):  Excessive amounts of blood in the stool  Significant tenderness or worsening of abdominal pains  Swelling of the abdomen that is new, acute  Fever of 100F or higher   For urgent or emergent issues, a gastroenterologist can be reached at any hour by calling [phone].   DIET:  We do recommend a small meal at first, but then you may proceed to your regular diet.  Drink plenty of fluids but you should avoid alcoholic beverages for 24 hours.  ACTIVITY:  You should plan to take it easy for the rest of today and you should NOT DRIVE or use heavy machinery until tomorrow (because of the sedation medicines used during the test).     FOLLOW UP: Our staff will call the number listed on your records the next business day following your procedure to check on you and address any questions or concerns that you may have regarding the information given to you following your procedure. If we do not reach you, we will leave a message.  However, if you are feeling well and you are not experiencing any problems, there is no need to return our call.  We will assume that you have returned to your regular daily activities without incident.  If any biopsies were taken you will be contacted by phone or by letter within the next 1-3 weeks.  Please call us at [phone] if you have not heard about the biopsies in 3 weeks.    SIGNATURES/CONFIDENTIALITY: You and/or your care partner have signed paperwork which will be entered into your electronic medical record.  These signatures attest to the fact that that the information above on your After Visit Summary has been reviewed and is understood.  Full responsibility of the confidentiality of this discharge information lies with you and/or your care-partner.    Handouts were given to your care partner on diverticulosis and hemorrhoids. You may resume your current medications today. Repeat colonoscopy in 5 years for surveillance per Dr. Silverio Decamp. Please call if any questions or concerns.

## 2017-02-13 ENCOUNTER — Telehealth: Payer: Self-pay

## 2017-02-13 NOTE — Telephone Encounter (Signed)
  Follow up Call-  Call back number 02/10/2017  Post procedure Call Back phone  # [phone]  Permission to leave phone message Yes  Some recent data might be hidden     Patient questions:  Do you have a fever, pain , or abdominal swelling? No. Pain Score  0 *  Have you tolerated food without any problems? Yes.    Have you been able to return to your normal activities? Yes.    Do you have any questions about your discharge instructions: Diet   No. Medications  No. Follow up visit  No.  Do you have questions or concerns about your Care? No.  Actions: * If pain score is 4 or above: No action needed, pain <4.  No problems noted per pt. maw

## 2017-02-20 MED FILL — FLUoxetine HCL 20 MG CAPS: 20 | 30 days supply | Qty: 30 | Fill #3

## 2017-02-22 MED FILL — oxyCODONE HCL 5 MG TABS: 5 | 17 days supply | Qty: 100 | Fill #0

## 2017-02-22 MED FILL — OXYMORPHONE HCL ER 5 MG TAB: 5 | 30 days supply | Qty: 60 | Fill #0

## 2017-02-23 ENCOUNTER — Encounter: Payer: Self-pay | Admitting: Podiatry

## 2017-02-23 ENCOUNTER — Ambulatory Visit (INDEPENDENT_AMBULATORY_CARE_PROVIDER_SITE_OTHER): Payer: 59 | Admitting: Podiatry

## 2017-02-23 DIAGNOSIS — M779 Enthesopathy, unspecified: Secondary | ICD-10-CM

## 2017-02-23 DIAGNOSIS — L84 Corns and callosities: Secondary | ICD-10-CM

## 2017-02-23 DIAGNOSIS — M2041 Other hammer toe(s) (acquired), right foot: Secondary | ICD-10-CM | POA: Diagnosis not present

## 2017-02-23 MED ORDER — TRIAMCINOLONE ACETONIDE 10 MG/ML IJ SUSP
10.0000 mg | Freq: Once | INTRAMUSCULAR | Status: AC
  Administered 2017-02-23: 10 mg

## 2017-02-24 NOTE — Progress Notes (Signed)
Subjective:    Patient ID: Rose Boyer, female   DOB: 61 y.o.   MRN: [id number]   HPI patient presents with significant digital deformity between the hallux and second toes right with keratotic lesion second toe and fluid around the interphalangeal joint. Patient states the symptoms a been quite sore and it makes it hard for her to wear shoe gear    ROS      Objective:  Physical Exam neurovascular status intact with inflammatory changes interphalangeal joint capsulitis digit 2 right along with keratotic lesion between the 2 toes and the medial side of the right hallux with elongated second toe and deviation of the big toe     Assessment:   H&P and education about condition rendered to patient. At this point did interphalangeal joint injection right second digit and went ahead and debrided lesions on both toes medial side of the big toe. I applied padding to both toes with explanations on how to do this and we discussed the possibility in future for digital shortening procedure and probable Akin osteotomy depending on response to this conservative care  X-rays indicate there is deviation of the hallux against the second toe with pain between the 2 toes and fluid buildup along with structural abnormality      Plan:

## 2017-02-28 MED FILL — ALPRAZolam 0.5 MG TABS: 0.5 | 30 days supply | Qty: 90 | Fill #1

## 2017-03-14 DIAGNOSIS — M961 Postlaminectomy syndrome, not elsewhere classified: Secondary | ICD-10-CM | POA: Diagnosis not present

## 2017-03-14 MED FILL — BUPRENORPHINE 20 MCG/HR PTC: 20 | 28 days supply | Qty: 4 | Fill #0

## 2017-03-20 DIAGNOSIS — Z6828 Body mass index (BMI) 28.0-28.9, adult: Secondary | ICD-10-CM | POA: Diagnosis not present

## 2017-03-20 DIAGNOSIS — Z01419 Encounter for gynecological examination (general) (routine) without abnormal findings: Secondary | ICD-10-CM | POA: Diagnosis not present

## 2017-03-20 MED FILL — FLUoxetine HCL 40 MG CAPS: 40 | 90 days supply | Qty: 90 | Fill #0

## 2017-03-23 MED FILL — oxyCODONE HCL 5 MG TABS: 5 | 20 days supply | Qty: 80 | Fill #0

## 2017-04-06 MED FILL — BUPRENORPHINE 20 MCG/HR PTC: 20 | 28 days supply | Qty: 4 | Fill #1

## 2017-04-24 MED FILL — HYDROCHLOROTHIAZIDE 50 MG T: 50 | 90 days supply | Qty: 180 | Fill #0

## 2017-04-24 MED FILL — ALPRAZolam 0.5 MG TABS: 0.5 | 90 days supply | Qty: 180 | Fill #0

## 2017-04-24 MED FILL — BUPROPION HCL XL 300 MG TAB: 300 | 90 days supply | Qty: 90 | Fill #0

## 2017-04-26 DIAGNOSIS — M961 Postlaminectomy syndrome, not elsewhere classified: Secondary | ICD-10-CM | POA: Diagnosis not present

## 2017-04-26 MED FILL — oxyCODONE HCL 5 MG TABS: 5 | 13 days supply | Qty: 80 | Fill #0

## 2017-05-04 MED FILL — BUPRENORPHINE 20 MCG/HR PTC: 20 | 28 days supply | Qty: 4 | Fill #0

## 2017-05-15 DIAGNOSIS — G4733 Obstructive sleep apnea (adult) (pediatric): Secondary | ICD-10-CM | POA: Diagnosis not present

## 2017-05-25 DIAGNOSIS — Z87891 Personal history of nicotine dependence: Secondary | ICD-10-CM | POA: Diagnosis not present

## 2017-05-25 DIAGNOSIS — K610 Anal abscess: Secondary | ICD-10-CM | POA: Diagnosis not present

## 2017-06-02 MED FILL — oxyCODONE HCL 5 MG TABS: 5 | 20 days supply | Qty: 80 | Fill #0

## 2017-06-02 MED FILL — BUPRENORPHINE 20 MCG/HR PTC: 20 | 28 days supply | Qty: 4 | Fill #1

## 2017-06-19 MED FILL — FLUoxetine HCL 40 MG CAPS: 40 | 90 days supply | Qty: 90 | Fill #1

## 2017-06-30 MED FILL — BUPRENORPHINE 20 MCG/HR PTC: 20 | 28 days supply | Qty: 4 | Fill #2

## 2017-07-27 DIAGNOSIS — H5203 Hypermetropia, bilateral: Secondary | ICD-10-CM | POA: Diagnosis not present

## 2017-07-27 DIAGNOSIS — H5213 Myopia, bilateral: Secondary | ICD-10-CM | POA: Diagnosis not present

## 2017-07-27 DIAGNOSIS — H52203 Unspecified astigmatism, bilateral: Secondary | ICD-10-CM | POA: Diagnosis not present

## 2017-07-27 DIAGNOSIS — H524 Presbyopia: Secondary | ICD-10-CM | POA: Diagnosis not present

## 2017-07-27 DIAGNOSIS — H2513 Age-related nuclear cataract, bilateral: Secondary | ICD-10-CM | POA: Diagnosis not present

## 2017-08-01 DIAGNOSIS — M961 Postlaminectomy syndrome, not elsewhere classified: Secondary | ICD-10-CM | POA: Diagnosis not present

## 2017-08-01 MED FILL — BUPROPION HCL XL 300 MG TAB: 300 | 90 days supply | Qty: 90 | Fill #1

## 2017-08-01 MED FILL — ALPRAZolam 0.5 MG TABS: 0.5 | 90 days supply | Qty: 180 | Fill #1

## 2017-08-04 MED FILL — HYDROCHLOROTHIAZIDE 50 MG T: 50 | 90 days supply | Qty: 180 | Fill #0

## 2017-08-08 DIAGNOSIS — K6289 Other specified diseases of anus and rectum: Secondary | ICD-10-CM | POA: Diagnosis not present

## 2017-08-08 DIAGNOSIS — K603 Anal fistula: Secondary | ICD-10-CM | POA: Diagnosis not present

## 2017-08-08 DIAGNOSIS — R198 Other specified symptoms and signs involving the digestive system and abdomen: Secondary | ICD-10-CM | POA: Diagnosis not present

## 2017-08-08 DIAGNOSIS — K641 Second degree hemorrhoids: Secondary | ICD-10-CM | POA: Diagnosis not present

## 2017-08-08 MED FILL — BUPRENORPHINE 20 MCG/HR PTC: 20 | 28 days supply | Qty: 4 | Fill #0

## 2017-08-21 MED FILL — LEVOTHYROXINE 125 MCG TAB: 125 | 90 days supply | Qty: 90 | Fill #0

## 2017-08-28 MED FILL — BUPRENORPHINE 15 MCG/HR PTW: 15 | 28 days supply | Qty: 4 | Fill #0

## 2017-08-28 MED FILL — oxyCODONE HCL 5 MG TABS: 5 | 20 days supply | Qty: 80 | Fill #0

## 2017-09-07 DIAGNOSIS — K603 Anal fistula: Secondary | ICD-10-CM | POA: Diagnosis not present

## 2017-09-07 DIAGNOSIS — F419 Anxiety disorder, unspecified: Secondary | ICD-10-CM | POA: Diagnosis not present

## 2017-09-07 DIAGNOSIS — K644 Residual hemorrhoidal skin tags: Secondary | ICD-10-CM | POA: Diagnosis not present

## 2017-09-07 DIAGNOSIS — G4733 Obstructive sleep apnea (adult) (pediatric): Secondary | ICD-10-CM | POA: Diagnosis not present

## 2017-09-07 DIAGNOSIS — Z87891 Personal history of nicotine dependence: Secondary | ICD-10-CM | POA: Diagnosis not present

## 2017-09-07 DIAGNOSIS — F329 Major depressive disorder, single episode, unspecified: Secondary | ICD-10-CM | POA: Diagnosis not present

## 2017-09-07 DIAGNOSIS — Z79899 Other long term (current) drug therapy: Secondary | ICD-10-CM | POA: Diagnosis not present

## 2017-09-07 DIAGNOSIS — K602 Anal fissure, unspecified: Secondary | ICD-10-CM | POA: Diagnosis not present

## 2017-09-13 DIAGNOSIS — K641 Second degree hemorrhoids: Secondary | ICD-10-CM | POA: Diagnosis not present

## 2017-09-13 DIAGNOSIS — Z87891 Personal history of nicotine dependence: Secondary | ICD-10-CM | POA: Diagnosis not present

## 2017-09-13 DIAGNOSIS — F419 Anxiety disorder, unspecified: Secondary | ICD-10-CM | POA: Diagnosis not present

## 2017-09-13 DIAGNOSIS — K603 Anal fistula: Secondary | ICD-10-CM | POA: Diagnosis not present

## 2017-09-13 DIAGNOSIS — G4733 Obstructive sleep apnea (adult) (pediatric): Secondary | ICD-10-CM | POA: Diagnosis not present

## 2017-09-13 DIAGNOSIS — K644 Residual hemorrhoidal skin tags: Secondary | ICD-10-CM | POA: Diagnosis not present

## 2017-09-13 DIAGNOSIS — Z79899 Other long term (current) drug therapy: Secondary | ICD-10-CM | POA: Diagnosis not present

## 2017-09-13 DIAGNOSIS — F329 Major depressive disorder, single episode, unspecified: Secondary | ICD-10-CM | POA: Diagnosis not present

## 2017-09-13 DIAGNOSIS — K602 Anal fissure, unspecified: Secondary | ICD-10-CM | POA: Diagnosis not present

## 2017-09-14 MED FILL — FLUoxetine HCL 40 MG CAPS: 40 | 90 days supply | Qty: 90 | Fill #2

## 2017-09-25 MED FILL — BUPRENORPHINE 15 MCG/HR PTW: 15 | 28 days supply | Qty: 4 | Fill #0

## 2017-09-27 ENCOUNTER — Other Ambulatory Visit (HOSPITAL_BASED_OUTPATIENT_CLINIC_OR_DEPARTMENT_OTHER): Payer: Self-pay | Admitting: Endocrinology

## 2017-09-27 DIAGNOSIS — E049 Nontoxic goiter, unspecified: Secondary | ICD-10-CM

## 2017-10-11 ENCOUNTER — Other Ambulatory Visit (HOSPITAL_BASED_OUTPATIENT_CLINIC_OR_DEPARTMENT_OTHER): Payer: 59

## 2017-10-17 ENCOUNTER — Ambulatory Visit (HOSPITAL_BASED_OUTPATIENT_CLINIC_OR_DEPARTMENT_OTHER)
Admission: RE | Admit: 2017-10-17 | Discharge: 2017-10-17 | Disposition: A | Discharge status: Home / Self Care | Payer: 59 | Source: Ambulatory Visit | Attending: Endocrinology | Admitting: Endocrinology

## 2017-10-17 DIAGNOSIS — E049 Nontoxic goiter, unspecified: Secondary | ICD-10-CM | POA: Diagnosis not present

## 2017-10-17 DIAGNOSIS — E039 Hypothyroidism, unspecified: Secondary | ICD-10-CM | POA: Diagnosis not present

## 2017-10-17 MED FILL — BUPRENORPHINE 15 MCG/HR PTW: 15 | 28 days supply | Qty: 4 | Fill #1

## 2017-10-31 MED FILL — HYDROCHLOROTHIAZIDE 50 MG T: 50 | 90 days supply | Qty: 180 | Fill #1

## 2017-11-01 MED FILL — ALPRAZolam 0.5 MG TABS: 0.5 | 90 days supply | Qty: 180 | Fill #0

## 2017-11-01 MED FILL — BUPROPION HCL XL 300 MG TAB: 300 | 90 days supply | Qty: 90 | Fill #0

## 2017-11-08 MED FILL — oxyCODONE HCL 5 MG TABS: 5 | 20 days supply | Qty: 80 | Fill #0

## 2017-11-08 MED FILL — BUPRENORPHINE 20 MCG/HR PTW: 20 | 28 days supply | Qty: 4 | Fill #0

## 2017-11-16 MED FILL — BUPRENORPHINE 10 MCG/HR PTW: 10 | 28 days supply | Qty: 4 | Fill #0

## 2017-12-05 MED FILL — FLUoxetine HCL 40 MG CAPS: 40 | 90 days supply | Qty: 90 | Fill #3

## 2017-12-05 MED FILL — LEVOTHYROXINE 125 MCG TAB: 125 | 90 days supply | Qty: 90 | Fill #1

## 2017-12-13 DIAGNOSIS — F329 Major depressive disorder, single episode, unspecified: Secondary | ICD-10-CM | POA: Diagnosis not present

## 2018-01-04 IMAGING — MR MR THORACIC SPINE W/O CM
6 of 7 series · 36 of 48 positions shown · non-contrast
Comparison: Cervical spine MRI 05/04/2015, and earlier.

CLINICAL DATA: 60-year-old female with thoracic radiculopathy.
Right shoulder, scapula and arm pain. Right hand numbness. Recent
new pain when flexing neck affecting the upper chest and axilla.
Chronic cervical neck pain. Previous ACDF.

EXAM:
MRI THORACIC SPINE WITHOUT CONTRAST
TECHNIQUE: Multiplanar, multisequence MR imaging of the thoracic spine was
performed. No intravenous contrast was administered.

[Series 4: T2 · sagittal · 3.0mm · 1.00mm/px · 4 of 15 slices shown (1 of 3)]
[im 1/15]
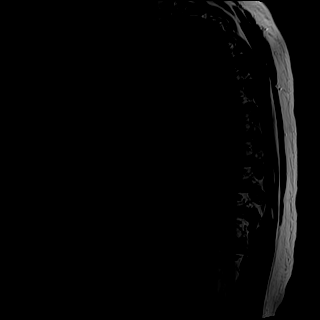
[im 5/15]
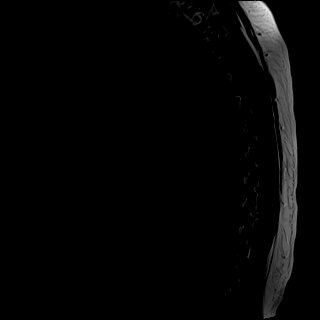
[im 10/15]
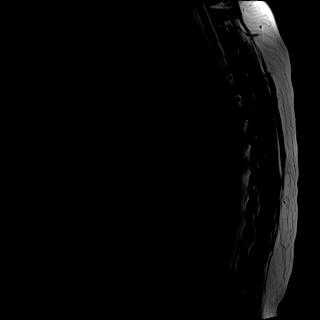
[im 15/15]
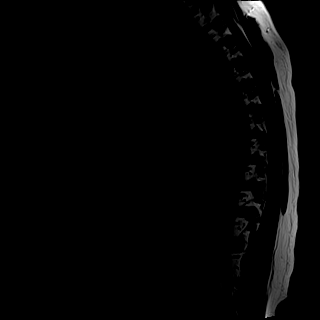

[Series 5: T1 · sagittal · 3.0mm · 1.00mm/px · 5 of 15 slices shown]
[im 1/15]
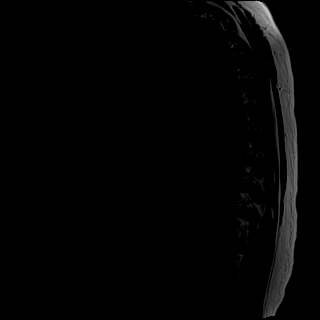
[im 4/15]
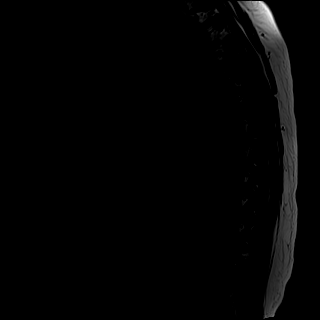
[im 8/15]
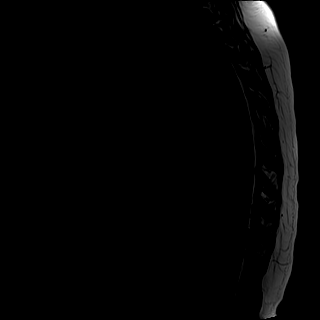
[im 11/15]
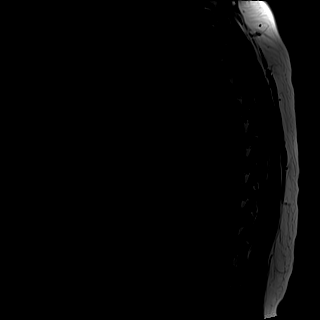
[im 15/15]
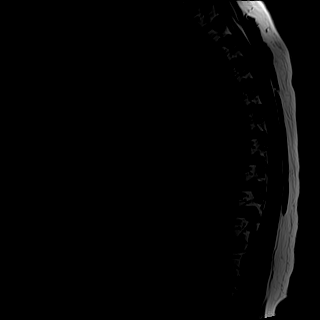

[Series 6: STIR · sagittal · 3.0mm · 1.00mm/px · 5 of 15 slices shown]
[im 1/15]
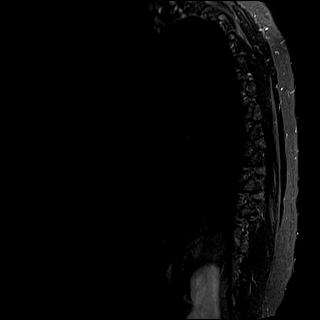
[im 4/15]
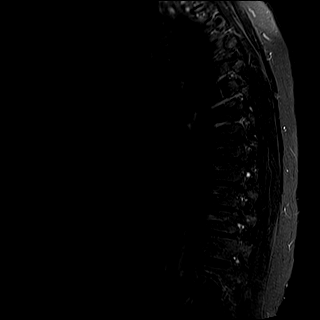
[im 8/15]
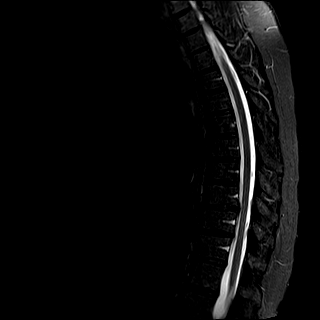
[im 11/15]
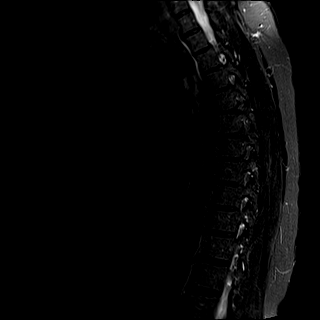
[im 15/15]
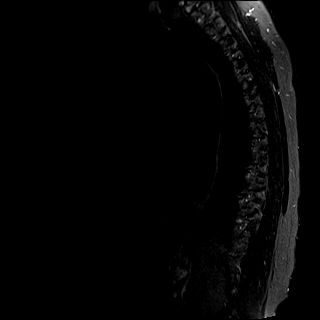

[Series 7: T2 · axial · 5.0mm · 0.86mm/px · z∈[-211,+22]mm · 9 of 39 slices shown (2 of 3)]
[im 1/39]
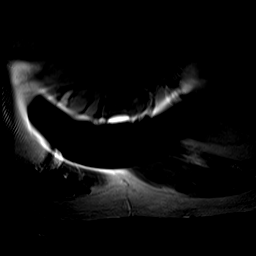
[im 7/39]
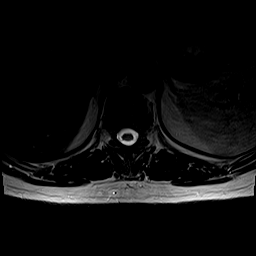
[im 13/39]
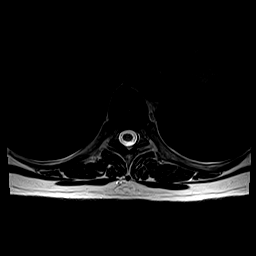
[im 16/39]
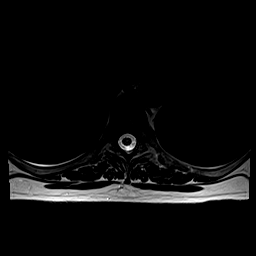
[im 20/39]
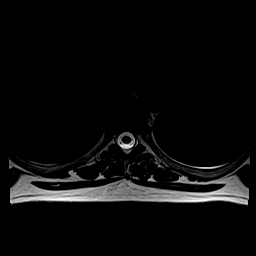
[im 23/39]
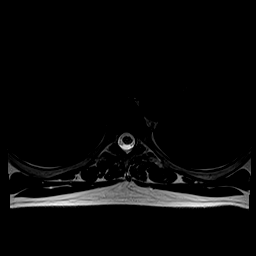
[im 26/39]
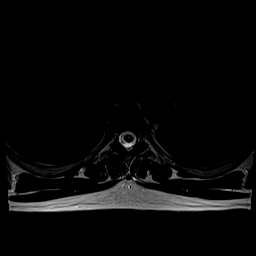
[im 32/39]
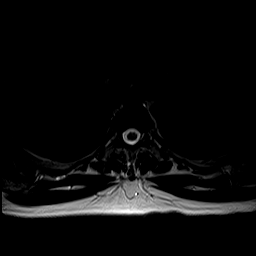
[im 39/39]
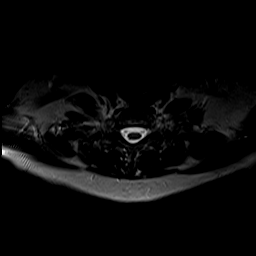

[Series 8: mpgr ax · axial · 5.0mm · 0.35mm/px · z∈[-215,-6]mm · 7 of 39 slices shown]
[im 1/39]
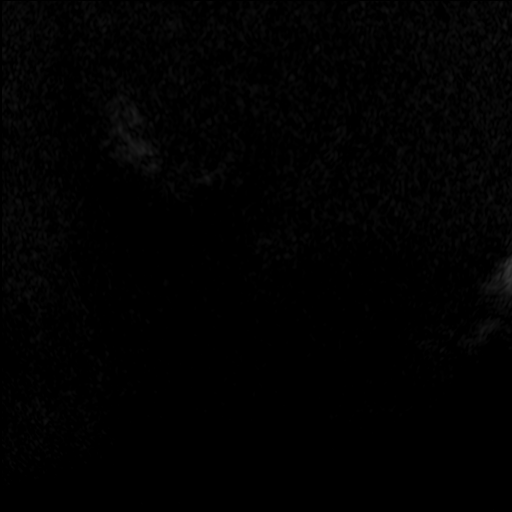
[im 7/39]
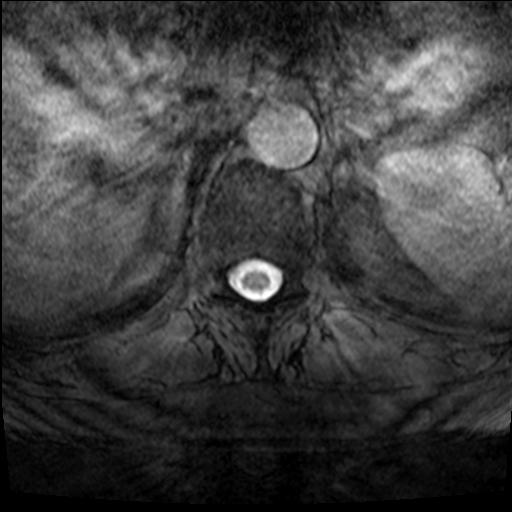
[im 13/39]
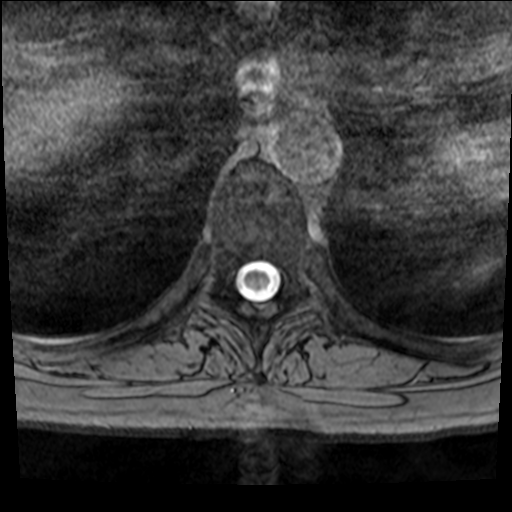
[im 16/39]
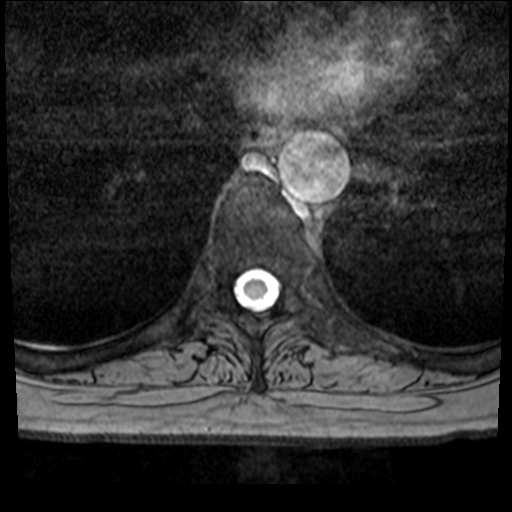
[im 23/39]
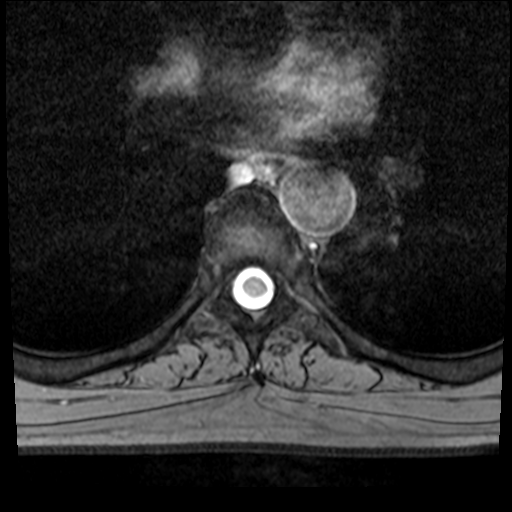
[im 26/39]
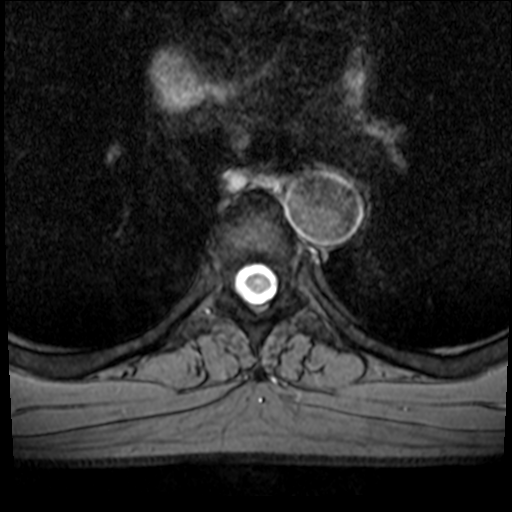
[im 32/39]
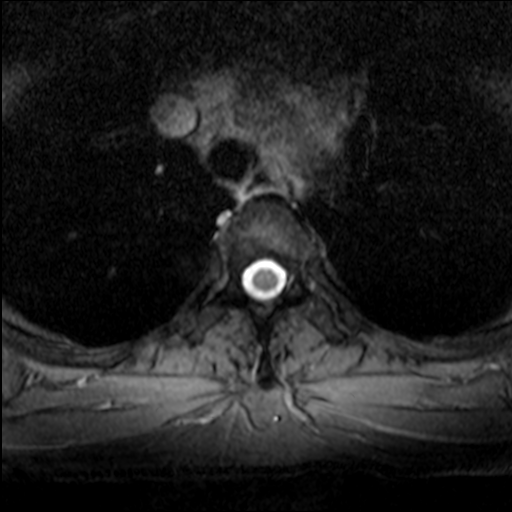

[Series 10: T2 · sagittal · 3.0mm · 0.56mm/px · 6 of 17 slices shown (3 of 3)]
[im 1/17]
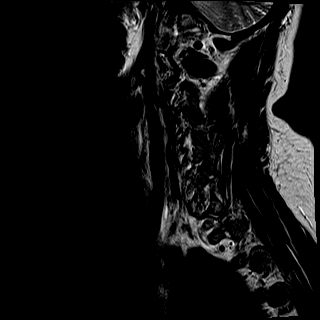
[im 4/17]
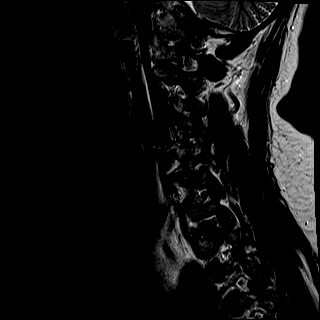
[im 7/17]
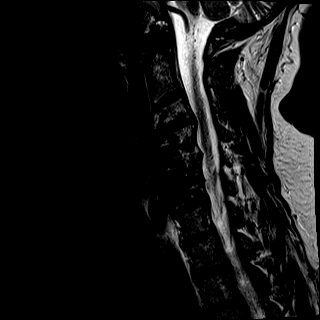
[im 10/17]
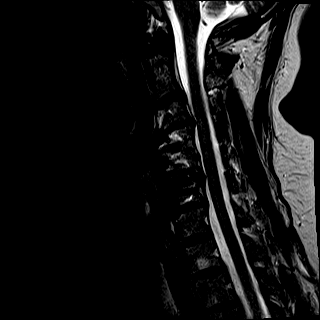
[im 13/17]
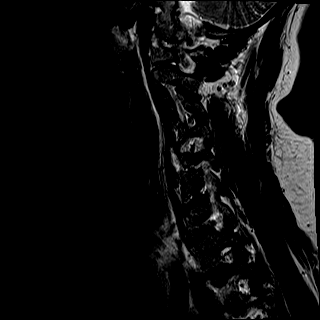
[im 17/17]
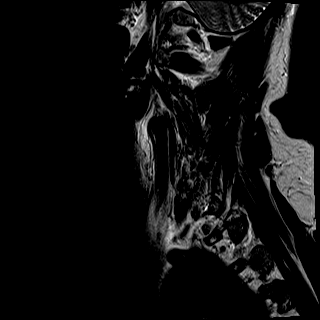

[36 of 48 positions shown; findings below may reference images not displayed]

FINDINGS: Limited sagittal imaging of the cervical spine re- demonstrates
C4-C5 and C5-C6 ACDF sequelae.

Alignment: Thoracic vertebral height and alignment is within normal
limits.

Vertebrae: No marrow edema or evidence of acute osseous abnormality.
Benign vertebral body hemangioma incidentally re- identified at T1.
Otherwise normal thoracic marrow signal.

Cord: Spinal cord signal is within normal limits at all visualized
levels. The conus medullaris appears normal on sagittal images at
T12-L1.

Paraspinal and other soft tissues: Negative visualized thoracic and
upper abdominal viscera. Negative visualized posterior paraspinal
soft tissues.

Disc levels:

C7-T1: Stable since 6561. Right uncovertebral hypertrophy with mild
to moderate right C8 foraminal stenosis.

T1-T2: Negative.

T2-T3: Mild facet hypertrophy, otherwise negative.

T3-T4: Mild to moderate facet hypertrophy greater on the right
(series 7, image 11). No significant stenosis and otherwise
negative.

T4-T5: Mild to moderate facet hypertrophy greater on the right
(series 7, image 13). No stenosis and otherwise negative.

T5-T6: Negative.

T6-T7: Negative.

T7-T8: Negative.

T8-T9: Negative.

T9-T10: Small right paracentral disc extrusion (series 4, image 8
and series 7, image 29). Mild facet and ligament flavum hypertrophy.
Mild narrowing of the right lateral recess with overall borderline
to Mild spinal stenosis. Borderline to mild right T9 foraminal
stenosis.

T10-T11: Mild circumferential disc bulge. Moderate facet and
ligament flavum hypertrophy. Borderline to mild spinal and bilateral
T10 foraminal stenosis.

T11-T12: Mild facet hypertrophy.  No stenosis.

T12-L1:  Minimal disc bulge.  No stenosis.
IMPRESSION: 1. Upper thoracic facet degeneration, but not resulting in upper
thoracic neural foraminal stenosis. Right C7-T1 neural foraminal
stenosis appears stable since the 6561 Mild to moderate cervical
exam.
2. Lower thoracic spine disc and posterior element degeneration
resulting in borderline to mild spinal and neural foraminal stenosis
at the T9-T10 and T10-T11 levels.
3.  No acute osseous abnormality in the thoracic spine.

## 2018-02-08 MED FILL — buPROPion HCL ER (XL) 300 M: 300 | 90 days supply | Qty: 90 | Fill #1

## 2018-02-08 MED FILL — HYDROCHLOROTHIAZIDE 50 MG T: 50 | 90 days supply | Qty: 180 | Fill #0

## 2018-02-08 MED FILL — ALPRAZolam 0.5 MG TABS: 0.5 | 90 days supply | Qty: 180 | Fill #1

## 2018-02-14 DIAGNOSIS — G4733 Obstructive sleep apnea (adult) (pediatric): Secondary | ICD-10-CM | POA: Diagnosis not present

## 2018-03-05 MED FILL — FLUoxetine HCL 40 MG CAPS: 40 | 90 days supply | Qty: 90 | Fill #0

## 2018-03-05 MED FILL — LEVOTHYROXINE 125 MCG TAB: 125 | 90 days supply | Qty: 90 | Fill #0

## 2018-03-13 ENCOUNTER — Ambulatory Visit (HOSPITAL_BASED_OUTPATIENT_CLINIC_OR_DEPARTMENT_OTHER)
Admission: RE | Admit: 2018-03-13 | Discharge: 2018-03-13 | Disposition: A | Discharge status: Home / Self Care | Payer: 59 | Source: Ambulatory Visit | Attending: Obstetrics & Gynecology | Admitting: Obstetrics & Gynecology

## 2018-03-13 ENCOUNTER — Other Ambulatory Visit (HOSPITAL_BASED_OUTPATIENT_CLINIC_OR_DEPARTMENT_OTHER): Payer: Self-pay | Admitting: Obstetrics & Gynecology

## 2018-03-13 DIAGNOSIS — M961 Postlaminectomy syndrome, not elsewhere classified: Secondary | ICD-10-CM | POA: Diagnosis not present

## 2018-03-13 DIAGNOSIS — Z1231 Encounter for screening mammogram for malignant neoplasm of breast: Secondary | ICD-10-CM | POA: Insufficient documentation

## 2018-03-13 DIAGNOSIS — Z780 Asymptomatic menopausal state: Secondary | ICD-10-CM

## 2018-03-13 MED FILL — BUPRENORPHINE 5 MCG/HR PTWK: 5 | 28 days supply | Qty: 4 | Fill #0

## 2018-03-22 ENCOUNTER — Ambulatory Visit (HOSPITAL_BASED_OUTPATIENT_CLINIC_OR_DEPARTMENT_OTHER): Payer: 59

## 2018-03-22 ENCOUNTER — Ambulatory Visit (HOSPITAL_BASED_OUTPATIENT_CLINIC_OR_DEPARTMENT_OTHER)
Admission: RE | Admit: 2018-03-22 | Discharge: 2018-03-22 | Disposition: A | Discharge status: Home / Self Care | Payer: 59 | Source: Ambulatory Visit | Attending: Obstetrics & Gynecology | Admitting: Obstetrics & Gynecology

## 2018-03-22 ENCOUNTER — Other Ambulatory Visit (HOSPITAL_BASED_OUTPATIENT_CLINIC_OR_DEPARTMENT_OTHER): Payer: Self-pay | Admitting: Obstetrics & Gynecology

## 2018-03-22 DIAGNOSIS — N958 Other specified menopausal and perimenopausal disorders: Secondary | ICD-10-CM | POA: Insufficient documentation

## 2018-03-22 DIAGNOSIS — Z01419 Encounter for gynecological examination (general) (routine) without abnormal findings: Secondary | ICD-10-CM | POA: Diagnosis not present

## 2018-03-22 DIAGNOSIS — Z6827 Body mass index (BMI) 27.0-27.9, adult: Secondary | ICD-10-CM | POA: Diagnosis not present

## 2018-03-22 DIAGNOSIS — Z780 Asymptomatic menopausal state: Secondary | ICD-10-CM | POA: Diagnosis not present

## 2018-03-22 DIAGNOSIS — M85852 Other specified disorders of bone density and structure, left thigh: Secondary | ICD-10-CM | POA: Diagnosis not present

## 2018-03-22 MED FILL — rOPINIRole HCL 0.25 MG TABS: 0.25 | 30 days supply | Qty: 180 | Fill #0

## 2018-04-20 MED FILL — rOPINIRole HCL 0.25 MG TABS: 0.25 | 30 days supply | Qty: 180 | Fill #1

## 2018-05-11 MED FILL — buPROPion HCL ER (XL) 300 M: 300 | 90 days supply | Qty: 90 | Fill #0

## 2018-05-11 MED FILL — ALPRAZolam 0.5 MG TABS: 0.5 | 30 days supply | Qty: 90 | Fill #0

## 2018-05-11 MED FILL — HYDROCHLOROTHIAZIDE 50 MG T: 50 | 90 days supply | Qty: 180 | Fill #1

## 2018-05-21 DIAGNOSIS — G4733 Obstructive sleep apnea (adult) (pediatric): Secondary | ICD-10-CM | POA: Diagnosis not present

## 2018-06-07 MED FILL — LEVOTHYROXINE 125 MCG TAB: 125 | 90 days supply | Qty: 90 | Fill #1

## 2018-06-07 MED FILL — rOPINIRole HCL 0.25 MG TABS: 0.25 | 30 days supply | Qty: 180 | Fill #2

## 2018-06-13 DIAGNOSIS — G2581 Restless legs syndrome: Secondary | ICD-10-CM | POA: Diagnosis not present

## 2018-07-27 MED FILL — rOPINIRole HCL 0.25 MG TABS: 0.25 | 90 days supply | Qty: 540 | Fill #0

## 2018-07-27 MED FILL — ALPRAZolam 0.25 MG TABS: 0.25 | 30 days supply | Qty: 120 | Fill #0

## 2018-08-01 MED FILL — HYDROCHLOROTHIAZIDE 50 MG T: 50 | 90 days supply | Qty: 180 | Fill #0

## 2018-08-01 MED FILL — buPROPion HCL ER (XL) 300 M: 300 | 90 days supply | Qty: 90 | Fill #0

## 2018-08-09 DIAGNOSIS — M1711 Unilateral primary osteoarthritis, right knee: Secondary | ICD-10-CM | POA: Diagnosis not present

## 2018-09-18 MED FILL — LEVOTHYROXINE 125 MCG TAB: 125 | 90 days supply | Qty: 90 | Fill #2

## 2022-02-17 ENCOUNTER — Encounter: Payer: Self-pay | Admitting: Gastroenterology
